# Patient Record
Sex: Female | Born: 1935 | Race: White | Hispanic: No | Marital: Married | Smoking: Never smoker
Health system: Southern US, Community
[De-identification: ages and names within clinical notes are randomized; demographics above are authoritative.]

## PROBLEM LIST (undated history)

## (undated) DIAGNOSIS — G373 Acute transverse myelitis in demyelinating disease of central nervous system: Secondary | ICD-10-CM

## (undated) DIAGNOSIS — I10 Essential (primary) hypertension: Secondary | ICD-10-CM

## (undated) DIAGNOSIS — K219 Gastro-esophageal reflux disease without esophagitis: Secondary | ICD-10-CM

## (undated) DIAGNOSIS — D649 Anemia, unspecified: Secondary | ICD-10-CM

## (undated) HISTORY — PX: PARAESOPHAGEAL HERNIA REPAIR: SHX2161

## (undated) HISTORY — PX: JOINT REPLACEMENT: SHX530

## (undated) HISTORY — PX: EPIDURAL BLOOD PATCH: SHX1517

## (undated) HISTORY — PX: FIXATION KYPHOPLASTY: SHX860

## (undated) HISTORY — PX: EYE SURGERY: SHX253

## (undated) HISTORY — PX: UPPER GI ENDOSCOPY: SHX6162

## (undated) HISTORY — PX: BACK SURGERY: SHX140

---
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
## 2012-01-25 DIAGNOSIS — M5137 Other intervertebral disc degeneration, lumbosacral region: Secondary | ICD-10-CM | POA: Insufficient documentation

## 2012-01-25 DIAGNOSIS — M51379 Other intervertebral disc degeneration, lumbosacral region without mention of lumbar back pain or lower extremity pain: Secondary | ICD-10-CM | POA: Insufficient documentation

## 2012-08-03 DIAGNOSIS — S12000A Unspecified displaced fracture of first cervical vertebra, initial encounter for closed fracture: Secondary | ICD-10-CM | POA: Insufficient documentation

## 2012-08-03 DIAGNOSIS — R739 Hyperglycemia, unspecified: Secondary | ICD-10-CM | POA: Insufficient documentation

## 2012-08-03 DIAGNOSIS — D509 Iron deficiency anemia, unspecified: Secondary | ICD-10-CM | POA: Insufficient documentation

## 2012-08-03 DIAGNOSIS — R0789 Other chest pain: Secondary | ICD-10-CM | POA: Insufficient documentation

## 2012-08-03 DIAGNOSIS — R1310 Dysphagia, unspecified: Secondary | ICD-10-CM | POA: Insufficient documentation

## 2012-11-12 DIAGNOSIS — K449 Diaphragmatic hernia without obstruction or gangrene: Secondary | ICD-10-CM | POA: Insufficient documentation

## 2014-01-01 DIAGNOSIS — G373 Acute transverse myelitis in demyelinating disease of central nervous system: Secondary | ICD-10-CM

## 2014-01-01 HISTORY — DX: Acute transverse myelitis in demyelinating disease of central nervous system: G37.3

## 2015-05-16 DIAGNOSIS — G373 Acute transverse myelitis in demyelinating disease of central nervous system: Secondary | ICD-10-CM | POA: Insufficient documentation

## 2015-05-19 DIAGNOSIS — R29898 Other symptoms and signs involving the musculoskeletal system: Secondary | ICD-10-CM | POA: Insufficient documentation

## 2015-05-20 DIAGNOSIS — I10 Essential (primary) hypertension: Secondary | ICD-10-CM | POA: Insufficient documentation

## 2016-10-29 DIAGNOSIS — D500 Iron deficiency anemia secondary to blood loss (chronic): Secondary | ICD-10-CM | POA: Insufficient documentation

## 2016-10-29 DIAGNOSIS — Z789 Other specified health status: Secondary | ICD-10-CM | POA: Insufficient documentation

## 2016-10-29 DIAGNOSIS — K219 Gastro-esophageal reflux disease without esophagitis: Secondary | ICD-10-CM | POA: Insufficient documentation

## 2016-10-29 DIAGNOSIS — Z7409 Other reduced mobility: Secondary | ICD-10-CM | POA: Insufficient documentation

## 2018-02-21 ENCOUNTER — Other Ambulatory Visit: Payer: Self-pay | Admitting: Neurosurgery

## 2018-02-21 ENCOUNTER — Telehealth: Payer: Self-pay | Admitting: Nurse Practitioner

## 2018-02-21 DIAGNOSIS — G970 Cerebrospinal fluid leak from spinal puncture: Secondary | ICD-10-CM

## 2018-02-21 NOTE — Telephone Encounter (Signed)
Phone call to patient to verify medication list and allergies for myelogram procedure. Pt instructed to hold Effexor for 48hrs prior to myelogram appointment time. Pt verbalized understanding. 

## 2018-03-03 ENCOUNTER — Ambulatory Visit
Admission: RE | Admit: 2018-03-03 | Discharge: 2018-03-03 | Disposition: A | Discharge status: Home / Self Care | Payer: Medicare PPO | Source: Ambulatory Visit | Attending: Neurosurgery | Admitting: Neurosurgery

## 2018-03-03 DIAGNOSIS — G970 Cerebrospinal fluid leak from spinal puncture: Secondary | ICD-10-CM

## 2018-03-03 MED ORDER — IOPAMIDOL (ISOVUE-M 200) INJECTION 41%: 15.0000 mL | Freq: Once | INTRAMUSCULAR | Status: DC

## 2018-03-03 NOTE — Progress Notes (Signed)
Patient states she has been off Effexor for at least the past two days. 

## 2018-03-03 NOTE — Discharge Instructions (Signed)
Myelogram Discharge Instructions  1. Go home and rest quietly for the next 24 hours.  It is important to lie flat for the next 24 hours.  Get up only to go to the restroom.  You may lie in the bed or on a couch on your back, your stomach, your left side or your right side.  You may have one pillow under your head.  You may have pillows between your knees while you are on your side or under your knees while you are on your back.  2. DO NOT drive today.  Recline the seat as far back as it will go, while still wearing your seat belt, on the way home.  3. You may get up to go to the bathroom as needed.  You may sit up for 10 minutes to eat.  You may resume your normal diet and medications unless otherwise indicated.  Drink lots of extra fluids today and tomorrow.  4. The incidence of headache, nausea, or vomiting is about 5% (one in 20 patients).  If you develop a headache, lie flat and drink plenty of fluids until the headache goes away.  Caffeinated beverages may be helpful.  If you develop severe nausea and vomiting or a headache that does not go away with flat bed rest, call [phone].  5. You may resume normal activities after your 24 hours of bed rest is over; however, do not exert yourself strongly or do any heavy lifting tomorrow. If when you get up you have a headache when standing, go back to bed and force fluids for another 24 hours.  6. Call your physician for a follow-up appointment.  The results of your myelogram will be sent directly to your physician by the following day.  7. If you have any questions or if complications develop after you arrive home, please call [phone].  Discharge instructions have been explained to the patient.  The patient, or the person responsible for the patient, fully understands these instructions.  YOU MAY RESTART YOUR [facility] TOMORROW 03/04/2018 AT 1:00PM.

## 2018-03-04 ENCOUNTER — Other Ambulatory Visit: Payer: Self-pay | Admitting: Neurosurgery

## 2018-03-04 DIAGNOSIS — G970 Cerebrospinal fluid leak from spinal puncture: Secondary | ICD-10-CM

## 2018-03-17 ENCOUNTER — Other Ambulatory Visit: Payer: Self-pay

## 2018-03-17 ENCOUNTER — Ambulatory Visit
Admission: RE | Admit: 2018-03-17 | Discharge: 2018-03-17 | Disposition: A | Discharge status: Home / Self Care | Payer: Medicare PPO | Source: Ambulatory Visit | Attending: Neurosurgery | Admitting: Neurosurgery

## 2018-03-17 DIAGNOSIS — G970 Cerebrospinal fluid leak from spinal puncture: Secondary | ICD-10-CM

## 2018-03-17 MED ORDER — IOPAMIDOL (ISOVUE-M 200) INJECTION 41%
20.0000 mL | Freq: Once | INTRAMUSCULAR | Status: AC
  Administered 2018-03-17: 20 mL via INTRATHECAL

## 2018-03-17 MED ORDER — DIAZEPAM 5 MG PO TABS
5.0000 mg | ORAL_TABLET | Freq: Once | ORAL | Status: AC
  Administered 2018-03-17: 5 mg via ORAL

## 2018-03-17 NOTE — Discharge Instructions (Signed)
Myelogram Discharge Instructions  1. Go home and rest quietly for the next 24 hours.  It is important to lie flat for the next 24 hours.  Get up only to go to the restroom.  You may lie in the bed or on a couch on your back, your stomach, your left side or your right side.  You may have one pillow under your head.  You may have pillows between your knees while you are on your side or under your knees while you are on your back.  2. DO NOT drive today.  Recline the seat as far back as it will go, while still wearing your seat belt, on the way home.  3. You may get up to go to the bathroom as needed.  You may sit up for 10 minutes to eat.  You may resume your normal diet and medications unless otherwise indicated.  Drink lots of extra fluids today and tomorrow.  4. The incidence of headache, nausea, or vomiting is about 5% (one in 20 patients).  If you develop a headache, lie flat and drink plenty of fluids until the headache goes away.  Caffeinated beverages may be helpful.  If you develop severe nausea and vomiting or a headache that does not go away with flat bed rest, call [phone].  5. You may resume normal activities after your 24 hours of bed rest is over; however, do not exert yourself strongly or do any heavy lifting tomorrow. If when you get up you have a headache when standing, go back to bed and force fluids for another 24 hours.  6. Call your physician for a follow-up appointment.  The results of your myelogram will be sent directly to your physician by the following day.  7. If you have any questions or if complications develop after you arrive home, please call [phone].  Discharge instructions have been explained to the patient.  The patient, or the person responsible for the patient, fully understands these instructions.  YOU MAY RESTART YOUR [facility] TOMORROW 03/18/2018 AT 10:30AM.

## 2018-07-17 ENCOUNTER — Other Ambulatory Visit: Payer: Self-pay | Admitting: Neurosurgery

## 2018-08-13 ENCOUNTER — Inpatient Hospital Stay (HOSPITAL_COMMUNITY): Admission: RE | Admit: 2018-08-13 | Payer: Medicare PPO | Source: Ambulatory Visit

## 2018-08-14 ENCOUNTER — Other Ambulatory Visit (HOSPITAL_COMMUNITY): Payer: Medicare PPO

## 2018-08-15 ENCOUNTER — Encounter (HOSPITAL_COMMUNITY): Admission: RE | Payer: Self-pay | Source: Home / Self Care

## 2018-08-15 ENCOUNTER — Ambulatory Visit (HOSPITAL_COMMUNITY): Admission: RE | Admit: 2018-08-15 | Payer: Medicare PPO | Source: Home / Self Care

## 2018-08-15 SURGERY — LUMBAR LAMINECTOMY/DECOMPRESSION MICRODISCECTOMY 1 LEVEL
Anesthesia: General

## 2018-08-27 ENCOUNTER — Other Ambulatory Visit: Payer: Self-pay | Admitting: Neurosurgery

## 2018-09-12 ENCOUNTER — Other Ambulatory Visit: Payer: Self-pay | Admitting: Neurosurgery

## 2018-09-15 ENCOUNTER — Other Ambulatory Visit (HOSPITAL_COMMUNITY)
Admission: RE | Admit: 2018-09-15 | Discharge: 2018-09-15 | Disposition: A | Discharge status: Home / Self Care | Payer: Medicare PPO | Source: Ambulatory Visit | Attending: Neurosurgery | Admitting: Neurosurgery

## 2018-09-15 DIAGNOSIS — Z20828 Contact with and (suspected) exposure to other viral communicable diseases: Secondary | ICD-10-CM | POA: Diagnosis not present

## 2018-09-15 DIAGNOSIS — Z01812 Encounter for preprocedural laboratory examination: Secondary | ICD-10-CM | POA: Diagnosis not present

## 2018-09-15 LAB — SARS CORONAVIRUS 2 (TAT 6-24 HRS): SARS Coronavirus 2: NEGATIVE

## 2018-09-16 ENCOUNTER — Inpatient Hospital Stay (HOSPITAL_COMMUNITY)
Admission: RE | Admit: 2018-09-16 | Discharge: 2018-09-16 | Disposition: A | Discharge status: Home / Self Care | Payer: Medicare PPO | Source: Ambulatory Visit

## 2018-09-17 ENCOUNTER — Encounter (HOSPITAL_COMMUNITY): Admission: RE | Payer: Self-pay | Source: Home / Self Care

## 2018-09-17 ENCOUNTER — Ambulatory Visit (HOSPITAL_COMMUNITY): Admission: RE | Admit: 2018-09-17 | Payer: Medicare PPO | Source: Home / Self Care

## 2018-09-17 SURGERY — LUMBAR LAMINECTOMY/DECOMPRESSION MICRODISCECTOMY 1 LEVEL
Anesthesia: General

## 2018-09-23 ENCOUNTER — Other Ambulatory Visit: Payer: Self-pay | Admitting: Neurosurgery

## 2018-10-07 NOTE — Pre-Procedure Instructions (Signed)
[id number] - [place], [place] - [address] [place] [address] Phone: [phone] Fax: [phone]     Your procedure is scheduled on Thursday October 8th.  Report to [facility] Main Entrance "A" at 10:30 A.M., and check in at the Admitting office.  Call this number if you have problems the morning of surgery:  [phone]  Call [phone] if you have any questions prior to your surgery date Monday-Friday 8am-4pm    Remember:  Do not eat or drink after midnight the night before your surgery     Take these medicines the morning of surgery with A SIP OF WATER  amLODipine (NORVASC)  cloNIDine (CATAPRES) desvenlafaxine (PRISTIQ)  As of today, STOP taking any Aspirin (unless otherwise instructed by your surgeon), Aleve, Naproxen, Ibuprofen, Motrin, Advil, Goody's, BC's, all herbal medications, fish oil, and all vitamins.    The Morning of Surgery  Do not wear jewelry, make-up or nail polish.  Do not wear lotions, powders, or perfumes/colognes, or deodorant  Do not shave 48 hours prior to surgery.  Men may shave face and neck.  Do not bring valuables to the hospital.  [facility] is not responsible for any belongings or valuables.  If you are a smoker, DO NOT Smoke 24 hours prior to surgery IF you wear a CPAP at night please bring your mask, tubing, and machine the morning of surgery   Remember that you must have someone to transport you home after your surgery, and remain with you for 24 hours if you are discharged the same day.   Contacts, glasses, hearing aids, dentures or bridgework may not be worn into surgery.    Leave your suitcase in the car.  After surgery it may be brought to your room.  For patients admitted to the hospital, discharge time will be determined by your treatment team.  Patients discharged the day of surgery will not be allowed to drive home.    Special instructions:   [facility]- Preparing For  Surgery  Before surgery, you can play an important role. Because skin is not sterile, your skin needs to be as free of germs as possible. You can reduce the number of germs on your skin by washing with CHG (chlorahexidine gluconate) Soap before surgery.  CHG is an antiseptic cleaner which kills germs and bonds with the skin to continue killing germs even after washing.    Oral Hygiene is also important to reduce your risk of infection.  Remember - BRUSH YOUR TEETH THE MORNING OF SURGERY WITH YOUR REGULAR TOOTHPASTE  Please do not use if you have an allergy to CHG or antibacterial soaps. If your skin becomes reddened/irritated stop using the CHG.  Do not shave (including legs and underarms) for at least 48 hours prior to first CHG shower. It is OK to shave your face.  Please follow these instructions carefully.   1. Shower the NIGHT BEFORE SURGERY and the MORNING OF SURGERY with CHG Soap.   2. If you chose to wash your hair, wash your hair first as usual with your normal shampoo.  3. After you shampoo, rinse your hair and body thoroughly to remove the shampoo.  4. Use CHG as you would any other liquid soap. You can apply CHG directly to the skin and wash gently with a scrungie or a clean washcloth.   5. Apply the CHG Soap to your body ONLY FROM THE NECK DOWN.  Do not use on  open wounds or open sores. Avoid contact with your eyes, ears, mouth and genitals (private parts). Wash Face and genitals (private parts)  with your normal soap.   6. Wash thoroughly, paying special attention to the area where your surgery will be performed.  7. Thoroughly rinse your body with warm water from the neck down.  8. DO NOT shower/wash with your normal soap after using and rinsing off the CHG Soap.  9. Pat yourself dry with a CLEAN TOWEL.  10. Wear CLEAN PAJAMAS to bed the night before surgery, wear comfortable clothes the morning of surgery  11. Place CLEAN SHEETS on your bed the night of your first  shower and DO NOT SLEEP WITH PETS.    Day of Surgery:  Do not apply any deodorants/lotions. Please shower the morning of surgery with the CHG soap  Please wear clean clothes to the hospital/surgery center.   Remember to brush your teeth WITH YOUR REGULAR TOOTHPASTE.   Please read over the following fact sheets that you were given.

## 2018-10-08 ENCOUNTER — Other Ambulatory Visit (HOSPITAL_COMMUNITY)
Admission: RE | Admit: 2018-10-08 | Discharge: 2018-10-08 | Disposition: A | Discharge status: Home / Self Care | Payer: Medicare PPO | Source: Ambulatory Visit | Attending: Neurosurgery | Admitting: Neurosurgery

## 2018-10-08 ENCOUNTER — Encounter (HOSPITAL_COMMUNITY): Payer: Self-pay

## 2018-10-08 ENCOUNTER — Encounter (HOSPITAL_COMMUNITY)
Admission: RE | Admit: 2018-10-08 | Discharge: 2018-10-08 | Disposition: A | Discharge status: Home / Self Care | Payer: Medicare PPO | Source: Ambulatory Visit | Attending: Neurosurgery | Admitting: Neurosurgery

## 2018-10-08 ENCOUNTER — Other Ambulatory Visit: Payer: Self-pay

## 2018-10-08 DIAGNOSIS — Z01810 Encounter for preprocedural cardiovascular examination: Secondary | ICD-10-CM | POA: Insufficient documentation

## 2018-10-08 DIAGNOSIS — Z01812 Encounter for preprocedural laboratory examination: Secondary | ICD-10-CM | POA: Insufficient documentation

## 2018-10-08 DIAGNOSIS — R9431 Abnormal electrocardiogram [ECG] [EKG]: Secondary | ICD-10-CM | POA: Insufficient documentation

## 2018-10-08 DIAGNOSIS — Z20828 Contact with and (suspected) exposure to other viral communicable diseases: Secondary | ICD-10-CM | POA: Insufficient documentation

## 2018-10-08 HISTORY — DX: Anemia, unspecified: D64.9

## 2018-10-08 HISTORY — DX: Gastro-esophageal reflux disease without esophagitis: K21.9

## 2018-10-08 HISTORY — DX: Essential (primary) hypertension: I10

## 2018-10-08 LAB — SURGICAL PCR SCREEN
MRSA, PCR: NEGATIVE
Staphylococcus aureus: NEGATIVE

## 2018-10-08 LAB — CBC
HCT: 44.8 % (ref 36.0–46.0)
Hemoglobin: 14.9 g/dL (ref 12.0–15.0)
MCH: 30.7 pg (ref 26.0–34.0)
MCHC: 33.3 g/dL (ref 30.0–36.0)
MCV: 92.2 fL (ref 80.0–100.0)
Platelets: 337 10*3/uL (ref 150–400)
RBC: 4.86 MIL/uL (ref 3.87–5.11)
RDW: 14.1 % (ref 11.5–15.5)
WBC: 9.9 10*3/uL (ref 4.0–10.5)
nRBC: 0 % (ref 0.0–0.2)

## 2018-10-08 LAB — BASIC METABOLIC PANEL
Anion gap: 13 (ref 5–15)
BUN: 30 mg/dL — ABNORMAL HIGH (ref 8–23)
CO2: 24 mmol/L (ref 22–32)
Calcium: 9.7 mg/dL (ref 8.9–10.3)
Chloride: 98 mmol/L (ref 98–111)
Creatinine, Ser: 1.12 mg/dL — ABNORMAL HIGH (ref 0.44–1.00)
GFR calc Af Amer: 54 mL/min — ABNORMAL LOW (ref >60)
GFR calc non Af Amer: 46 mL/min — ABNORMAL LOW (ref >60)
Glucose, Bld: 89 mg/dL (ref 70–99)
Potassium: 3.4 mmol/L — ABNORMAL LOW (ref 3.5–5.1)
Sodium: 135 mmol/L (ref 135–145)

## 2018-10-08 LAB — SARS CORONAVIRUS 2 (TAT 6-24 HRS): SARS Coronavirus 2: NEGATIVE

## 2018-10-08 NOTE — Progress Notes (Signed)
PCP - Dr. Edythe Lynn- Cyndi Bender, [place] Cardiologist - denies  Chest x-ray - N/A EKG - 10/09/18 Stress Test - ? If done in 2018, records requested ECHO - 08/05/12 Cardiac Cath - denies  Sleep Study - denies  Aspirin Instructions: Patient instructed to hold all Aspirin, NSAID's, herbal medications, fish oil and vitamins 7 days prior to surgery.   Anesthesia review: cardiac studies requested, h/o transverse myelitis  Patient denies shortness of breath, fever, cough and chest pain at PAT appointment   Patient verbalized understanding of instructions that were given to them at the PAT appointment. Patient was also instructed that they will need to review over the PAT instructions again at home before surgery.

## 2018-10-08 NOTE — Anesthesia Preprocedure Evaluation (Addendum)
Anesthesia Evaluation  Patient identified by MRN, date of birth, ID band Patient awake    Reviewed: Allergy & Precautions, NPO status , Patient's Chart, lab work & pertinent test results  Airway Mallampati: I  TM Distance: >3 FB Neck ROM: Full    Dental   Pulmonary    Pulmonary exam normal        Cardiovascular hypertension, Pt. on medications Normal cardiovascular exam     Neuro/Psych    GI/Hepatic GERD  Medicated and Controlled,  Endo/Other    Renal/GU      Musculoskeletal   Abdominal   Peds  Hematology   Anesthesia Other Findings   Reproductive/Obstetrics                            Anesthesia Physical Anesthesia Plan  ASA: II  Anesthesia Plan: General   Post-op Pain Management:    Induction: Intravenous  PONV Risk Score and Plan: 3 and Ondansetron, Midazolam and Dexamethasone  Airway Management Planned: Oral ETT  Additional Equipment:   Intra-op Plan:   Post-operative Plan: Extubation in OR  Informed Consent: I have reviewed the patients History and Physical, chart, labs and discussed the procedure including the risks, benefits and alternatives for the proposed anesthesia with the patient or authorized representative who has indicated his/her understanding and acceptance.       Plan Discussed with: CRNA and Surgeon  Anesthesia Plan Comments: (TTE 08/05/12 (care everywhere): Interpretation Summary A complete two-dimensional transthoracic echocardiogram was performed (2D, M-mode, Doppler and color flow Doppler). The study was technically adequate. The tricupid valve leaflets are structurally normal with trace regurgitation. The aortic valve is trileaflet. The left ventricle is normal in size. There is mild concentric left ventricular hypertrophy. The left ventricular wall motion is normal. The left ventricular ejection fraction is normal (60-65%). The left ventricular  diastolic function is normal.)       Anesthesia Quick Evaluation

## 2018-10-09 ENCOUNTER — Inpatient Hospital Stay (HOSPITAL_COMMUNITY)
Admission: AD | Admit: 2018-10-09 | Discharge: 2018-10-15 | DRG: 029 | Disposition: A | Discharge status: Rehabilitation Facility | Payer: Medicare PPO | Attending: Neurosurgery | Admitting: Neurosurgery

## 2018-10-09 ENCOUNTER — Ambulatory Visit (HOSPITAL_COMMUNITY): Payer: Medicare PPO

## 2018-10-09 ENCOUNTER — Ambulatory Visit (HOSPITAL_COMMUNITY): Payer: Medicare PPO | Admitting: Certified Registered Nurse Anesthetist

## 2018-10-09 ENCOUNTER — Encounter (HOSPITAL_COMMUNITY)
Admission: AD | Disposition: A | Discharge status: Rehabilitation Facility | Payer: Self-pay | Source: Home / Self Care | Attending: Neurosurgery

## 2018-10-09 ENCOUNTER — Encounter (HOSPITAL_COMMUNITY): Payer: Self-pay | Admitting: *Deleted

## 2018-10-09 ENCOUNTER — Ambulatory Visit (HOSPITAL_COMMUNITY): Payer: Medicare PPO | Admitting: Physician Assistant

## 2018-10-09 DIAGNOSIS — Z888 Allergy status to other drugs, medicaments and biological substances status: Secondary | ICD-10-CM

## 2018-10-09 DIAGNOSIS — Z8249 Family history of ischemic heart disease and other diseases of the circulatory system: Secondary | ICD-10-CM

## 2018-10-09 DIAGNOSIS — G970 Cerebrospinal fluid leak from spinal puncture: Secondary | ICD-10-CM | POA: Diagnosis present

## 2018-10-09 DIAGNOSIS — K5903 Drug induced constipation: Secondary | ICD-10-CM

## 2018-10-09 DIAGNOSIS — Z79899 Other long term (current) drug therapy: Secondary | ICD-10-CM

## 2018-10-09 DIAGNOSIS — F329 Major depressive disorder, single episode, unspecified: Secondary | ICD-10-CM

## 2018-10-09 DIAGNOSIS — Z96651 Presence of right artificial knee joint: Secondary | ICD-10-CM | POA: Diagnosis present

## 2018-10-09 DIAGNOSIS — I10 Essential (primary) hypertension: Secondary | ICD-10-CM | POA: Diagnosis present

## 2018-10-09 DIAGNOSIS — Z23 Encounter for immunization: Secondary | ICD-10-CM

## 2018-10-09 DIAGNOSIS — Z419 Encounter for procedure for purposes other than remedying health state, unspecified: Secondary | ICD-10-CM

## 2018-10-09 DIAGNOSIS — N179 Acute kidney failure, unspecified: Secondary | ICD-10-CM | POA: Diagnosis present

## 2018-10-09 DIAGNOSIS — K219 Gastro-esophageal reflux disease without esophagitis: Secondary | ICD-10-CM | POA: Diagnosis present

## 2018-10-09 DIAGNOSIS — Z833 Family history of diabetes mellitus: Secondary | ICD-10-CM

## 2018-10-09 DIAGNOSIS — Z20828 Contact with and (suspected) exposure to other viral communicable diseases: Secondary | ICD-10-CM | POA: Diagnosis present

## 2018-10-09 DIAGNOSIS — G9609 Other spinal cerebrospinal fluid leak: Principal | ICD-10-CM | POA: Diagnosis present

## 2018-10-09 DIAGNOSIS — E876 Hypokalemia: Secondary | ICD-10-CM | POA: Diagnosis present

## 2018-10-09 DIAGNOSIS — G8918 Other acute postprocedural pain: Secondary | ICD-10-CM

## 2018-10-09 HISTORY — PX: LUMBAR LAMINECTOMY/DECOMPRESSION MICRODISCECTOMY: SHX5026

## 2018-10-09 SURGERY — LUMBAR LAMINECTOMY/DECOMPRESSION MICRODISCECTOMY 1 LEVEL
Anesthesia: General

## 2018-10-09 MED ORDER — ROCURONIUM BROMIDE 10 MG/ML (PF) SYRINGE
PREFILLED_SYRINGE | INTRAVENOUS | Status: DC | PRN
  Administered 2018-10-09: 80 mg via INTRAVENOUS

## 2018-10-09 MED ORDER — BUTALBITAL-APAP-CAFFEINE 50-325-40 MG PO TABS
1.0000 | ORAL_TABLET | Freq: Two times a day (BID) | ORAL | Status: DC
  Administered 2018-10-09 – 2018-10-15 (×12): 1 via ORAL
  Filled 2018-10-09 (×12): qty 1

## 2018-10-09 MED ORDER — FENTANYL CITRATE (PF) 250 MCG/5ML IJ SOLN
INTRAMUSCULAR | Status: DC | PRN
  Administered 2018-10-09: 50 ug via INTRAVENOUS
  Administered 2018-10-09: 25 ug via INTRAVENOUS
  Administered 2018-10-09: 125 ug via INTRAVENOUS

## 2018-10-09 MED ORDER — ONDANSETRON HCL 4 MG/2ML IJ SOLN
4.0000 mg | Freq: Four times a day (QID) | INTRAMUSCULAR | Status: DC | PRN
  Administered 2018-10-11 – 2018-10-14 (×2): 4 mg via INTRAVENOUS
  Filled 2018-10-09 (×5): qty 2

## 2018-10-09 MED ORDER — ONDANSETRON HCL 4 MG/2ML IJ SOLN
INTRAMUSCULAR | Status: DC | PRN
  Administered 2018-10-09: 4 mg via INTRAVENOUS

## 2018-10-09 MED ORDER — LACTATED RINGERS IV SOLN: INTRAVENOUS | Status: DC

## 2018-10-09 MED ORDER — CEFAZOLIN SODIUM-DEXTROSE 2-4 GM/100ML-% IV SOLN
INTRAVENOUS | Status: AC
  Filled 2018-10-09: qty 100

## 2018-10-09 MED ORDER — CYCLOBENZAPRINE HCL 10 MG PO TABS: 10.0000 mg | ORAL_TABLET | Freq: Three times a day (TID) | ORAL | Status: DC | PRN

## 2018-10-09 MED ORDER — THROMBIN 5000 UNITS EX SOLR
CUTANEOUS | Status: DC | PRN
  Administered 2018-10-09 (×2): 5000 [IU] via TOPICAL

## 2018-10-09 MED ORDER — POTASSIUM CHLORIDE IN NACL 20-0.9 MEQ/L-% IV SOLN: INTRAVENOUS | Status: DC

## 2018-10-09 MED ORDER — MENTHOL 3 MG MT LOZG: 1.0000 | LOZENGE | OROMUCOSAL | Status: DC | PRN

## 2018-10-09 MED ORDER — VALSARTAN-HYDROCHLOROTHIAZIDE 80-12.5 MG PO TABS: 1.0000 | ORAL_TABLET | Freq: Every day | ORAL | Status: DC

## 2018-10-09 MED ORDER — LIDOCAINE-EPINEPHRINE 0.5 %-1:200000 IJ SOLN
INTRAMUSCULAR | Status: DC | PRN
  Administered 2018-10-09: 10 mL

## 2018-10-09 MED ORDER — 0.9 % SODIUM CHLORIDE (POUR BTL) OPTIME
TOPICAL | Status: DC | PRN
  Administered 2018-10-09: 12:00:00 1000 mL

## 2018-10-09 MED ORDER — DEXAMETHASONE SODIUM PHOSPHATE 10 MG/ML IJ SOLN
INTRAMUSCULAR | Status: DC | PRN
  Administered 2018-10-09: 5 mg via INTRAVENOUS

## 2018-10-09 MED ORDER — IRBESARTAN 75 MG PO TABS
75.0000 mg | ORAL_TABLET | Freq: Every day | ORAL | Status: DC
  Administered 2018-10-10 – 2018-10-15 (×6): 75 mg via ORAL
  Filled 2018-10-09 (×6): qty 1

## 2018-10-09 MED ORDER — FENTANYL CITRATE (PF) 250 MCG/5ML IJ SOLN
INTRAMUSCULAR | Status: AC
  Filled 2018-10-09: qty 5

## 2018-10-09 MED ORDER — CLONIDINE HCL 0.2 MG PO TABS: 0.2000 mg | ORAL_TABLET | Freq: Four times a day (QID) | ORAL | Status: DC | PRN

## 2018-10-09 MED ORDER — SODIUM CHLORIDE 0.9% FLUSH: 3.0000 mL | INTRAVENOUS | Status: DC | PRN

## 2018-10-09 MED ORDER — OXYCODONE HCL 5 MG PO TABS
5.0000 mg | ORAL_TABLET | ORAL | Status: DC | PRN
  Administered 2018-10-15: 5 mg via ORAL
  Filled 2018-10-09: qty 1

## 2018-10-09 MED ORDER — BUTALBITAL-APAP-CAFFEINE 50-325-40 MG PO CAPS: 1.0000 | ORAL_CAPSULE | Freq: Two times a day (BID) | ORAL | Status: DC

## 2018-10-09 MED ORDER — ONDANSETRON HCL 4 MG PO TABS
4.0000 mg | ORAL_TABLET | Freq: Four times a day (QID) | ORAL | Status: DC | PRN
  Administered 2018-10-09 – 2018-10-14 (×5): 4 mg via ORAL
  Filled 2018-10-09 (×5): qty 1

## 2018-10-09 MED ORDER — ACETAMINOPHEN 650 MG RE SUPP: 650.0000 mg | RECTAL | Status: DC | PRN

## 2018-10-09 MED ORDER — ROCURONIUM BROMIDE 10 MG/ML (PF) SYRINGE
PREFILLED_SYRINGE | INTRAVENOUS | Status: AC
  Filled 2018-10-09: qty 10

## 2018-10-09 MED ORDER — BUPIVACAINE HCL (PF) 0.5 % IJ SOLN
INTRAMUSCULAR | Status: AC
  Filled 2018-10-09: qty 30

## 2018-10-09 MED ORDER — HEMOSTATIC AGENTS (NO CHARGE) OPTIME
TOPICAL | Status: DC | PRN
  Administered 2018-10-09: 1 via TOPICAL

## 2018-10-09 MED ORDER — SODIUM CHLORIDE 0.9 % IV SOLN
INTRAVENOUS | Status: DC | PRN
  Administered 2018-10-09: 14:00:00 25 ug/min via INTRAVENOUS

## 2018-10-09 MED ORDER — SODIUM CHLORIDE 0.9% FLUSH
3.0000 mL | Freq: Two times a day (BID) | INTRAVENOUS | Status: DC
  Administered 2018-10-10 – 2018-10-15 (×11): 3 mL via INTRAVENOUS

## 2018-10-09 MED ORDER — PHENOL 1.4 % MT LIQD: 1.0000 | OROMUCOSAL | Status: DC | PRN

## 2018-10-09 MED ORDER — OXYCODONE HCL 5 MG PO TABS
10.0000 mg | ORAL_TABLET | ORAL | Status: DC | PRN
  Administered 2018-10-10 – 2018-10-12 (×7): 10 mg via ORAL
  Filled 2018-10-09 (×7): qty 2

## 2018-10-09 MED ORDER — MEPERIDINE HCL 25 MG/ML IJ SOLN: 6.2500 mg | INTRAMUSCULAR | Status: DC | PRN

## 2018-10-09 MED ORDER — SUGAMMADEX SODIUM 200 MG/2ML IV SOLN
INTRAVENOUS | Status: DC | PRN
  Administered 2018-10-09: 150 mg via INTRAVENOUS

## 2018-10-09 MED ORDER — SODIUM CHLORIDE 0.9 % IV SOLN: 250.0000 mL | INTRAVENOUS | Status: DC

## 2018-10-09 MED ORDER — PROPOFOL 10 MG/ML IV BOLUS
INTRAVENOUS | Status: AC
  Filled 2018-10-09: qty 20

## 2018-10-09 MED ORDER — HYDROMORPHONE HCL 1 MG/ML IJ SOLN
INTRAMUSCULAR | Status: AC
  Filled 2018-10-09: qty 1

## 2018-10-09 MED ORDER — HYDROCHLOROTHIAZIDE 12.5 MG PO CAPS
12.5000 mg | ORAL_CAPSULE | Freq: Every day | ORAL | Status: DC
  Administered 2018-10-10 – 2018-10-15 (×6): 12.5 mg via ORAL
  Filled 2018-10-09 (×6): qty 1

## 2018-10-09 MED ORDER — LIDOCAINE HCL (CARDIAC) PF 100 MG/5ML IV SOSY
PREFILLED_SYRINGE | INTRAVENOUS | Status: DC | PRN
  Administered 2018-10-09: 100 mg via INTRAVENOUS

## 2018-10-09 MED ORDER — CEFAZOLIN SODIUM-DEXTROSE 2-3 GM-%(50ML) IV SOLR
INTRAVENOUS | Status: DC | PRN
  Administered 2018-10-09: 2 g via INTRAVENOUS

## 2018-10-09 MED ORDER — ONDANSETRON HCL 4 MG/2ML IJ SOLN
INTRAMUSCULAR | Status: AC
  Filled 2018-10-09: qty 2

## 2018-10-09 MED ORDER — HYDROMORPHONE HCL 1 MG/ML IJ SOLN
0.2500 mg | INTRAMUSCULAR | Status: DC | PRN
  Administered 2018-10-09: 0.5 mg via INTRAVENOUS

## 2018-10-09 MED ORDER — DESMOPRESSIN ACETATE 0.1 MG PO TABS
0.1000 mg | ORAL_TABLET | Freq: Every day | ORAL | Status: DC
  Administered 2018-10-09 – 2018-10-14 (×6): 0.1 mg via ORAL
  Filled 2018-10-09 (×7): qty 1

## 2018-10-09 MED ORDER — BUPIVACAINE HCL (PF) 0.5 % IJ SOLN
INTRAMUSCULAR | Status: DC | PRN
  Administered 2018-10-09: 20 mL

## 2018-10-09 MED ORDER — CELECOXIB 200 MG PO CAPS
200.0000 mg | ORAL_CAPSULE | Freq: Two times a day (BID) | ORAL | Status: DC
  Administered 2018-10-09 – 2018-10-15 (×12): 200 mg via ORAL
  Filled 2018-10-09 (×12): qty 1

## 2018-10-09 MED ORDER — AMLODIPINE BESYLATE 10 MG PO TABS
10.0000 mg | ORAL_TABLET | Freq: Every day | ORAL | Status: DC
  Administered 2018-10-10 – 2018-10-15 (×6): 10 mg via ORAL
  Filled 2018-10-09 (×6): qty 1

## 2018-10-09 MED ORDER — LACTATED RINGERS IV SOLN
INTRAVENOUS | Status: DC
  Administered 2018-10-09: 14:00:00 via INTRAVENOUS

## 2018-10-09 MED ORDER — PROPOFOL 10 MG/ML IV BOLUS
INTRAVENOUS | Status: DC | PRN
  Administered 2018-10-09: 120 mg via INTRAVENOUS

## 2018-10-09 MED ORDER — LIDOCAINE-EPINEPHRINE 0.5 %-1:200000 IJ SOLN
INTRAMUSCULAR | Status: AC
  Filled 2018-10-09: qty 1

## 2018-10-09 MED ORDER — ONDANSETRON HCL 4 MG/2ML IJ SOLN: 4.0000 mg | Freq: Once | INTRAMUSCULAR | Status: DC | PRN

## 2018-10-09 MED ORDER — ACETAMINOPHEN 325 MG PO TABS
650.0000 mg | ORAL_TABLET | ORAL | Status: DC | PRN
  Administered 2018-10-13: 16:00:00 650 mg via ORAL
  Filled 2018-10-09: qty 2

## 2018-10-09 MED ORDER — THROMBIN 5000 UNITS EX SOLR
CUTANEOUS | Status: AC
  Filled 2018-10-09: qty 10000

## 2018-10-09 MED ORDER — PHENYLEPHRINE 40 MCG/ML (10ML) SYRINGE FOR IV PUSH (FOR BLOOD PRESSURE SUPPORT)
PREFILLED_SYRINGE | INTRAVENOUS | Status: DC | PRN
  Administered 2018-10-09: 80 ug via INTRAVENOUS

## 2018-10-09 MED ORDER — LIDOCAINE 2% (20 MG/ML) 5 ML SYRINGE
INTRAMUSCULAR | Status: AC
  Filled 2018-10-09: qty 5

## 2018-10-09 MED ORDER — DEXAMETHASONE SODIUM PHOSPHATE 10 MG/ML IJ SOLN
INTRAMUSCULAR | Status: AC
  Filled 2018-10-09: qty 1

## 2018-10-09 SURGICAL SUPPLY — 61 items
BENZOIN TINCTURE PRP APPL 2/3 (GAUZE/BANDAGES/DRESSINGS) IMPLANT
BLADE CLIPPER SURG (BLADE) IMPLANT
BUR MATCHSTICK NEURO 3.0 LAGG (BURR) ×3 IMPLANT
BUR PRECISION FLUTE 5.0 (BURR) ×2 IMPLANT
CANISTER SUCT 3000ML PPV (MISCELLANEOUS) ×3 IMPLANT
CARTRIDGE OIL MAESTRO DRILL (MISCELLANEOUS) ×1 IMPLANT
CLOSURE WOUND 1/2 X4 (GAUZE/BANDAGES/DRESSINGS)
COVER WAND RF STERILE (DRAPES) ×3 IMPLANT
DECANTER SPIKE VIAL GLASS SM (MISCELLANEOUS) ×3 IMPLANT
DERMABOND ADVANCED (GAUZE/BANDAGES/DRESSINGS) ×2
DERMABOND ADVANCED .7 DNX12 (GAUZE/BANDAGES/DRESSINGS) ×1 IMPLANT
DIFFUSER DRILL AIR PNEUMATIC (MISCELLANEOUS) ×3 IMPLANT
DRAPE HALF SHEET 40X57 (DRAPES) ×2 IMPLANT
DRAPE LAPAROTOMY 100X72X124 (DRAPES) ×3 IMPLANT
DRAPE MICROSCOPE LEICA (MISCELLANEOUS) ×3 IMPLANT
DRAPE SURG 17X23 STRL (DRAPES) ×3 IMPLANT
DURAPREP 26ML APPLICATOR (WOUND CARE) ×3 IMPLANT
ELECT REM PT RETURN 9FT ADLT (ELECTROSURGICAL) ×3
ELECTRODE REM PT RTRN 9FT ADLT (ELECTROSURGICAL) ×1 IMPLANT
GAUZE 4X4 16PLY RFD (DISPOSABLE) IMPLANT
GAUZE SPONGE 4X4 12PLY STRL (GAUZE/BANDAGES/DRESSINGS) IMPLANT
GLOVE BIO SURGEON STRL SZ 6.5 (GLOVE) ×3 IMPLANT
GLOVE BIO SURGEON STRL SZ8 (GLOVE) ×2 IMPLANT
GLOVE BIO SURGEONS STRL SZ 6.5 (GLOVE) ×3
GLOVE BIOGEL PI IND STRL 6.5 (GLOVE) IMPLANT
GLOVE BIOGEL PI IND STRL 7.5 (GLOVE) IMPLANT
GLOVE BIOGEL PI IND STRL 8.5 (GLOVE) IMPLANT
GLOVE BIOGEL PI INDICATOR 6.5 (GLOVE) ×4
GLOVE BIOGEL PI INDICATOR 7.5 (GLOVE) ×2
GLOVE BIOGEL PI INDICATOR 8.5 (GLOVE) ×2
GLOVE ECLIPSE 6.5 STRL STRAW (GLOVE) ×3 IMPLANT
GLOVE EXAM NITRILE XL STR (GLOVE) IMPLANT
GLOVE SURG SS PI 7.5 STRL IVOR (GLOVE) ×2 IMPLANT
GOWN STRL REUS W/ TWL LRG LVL3 (GOWN DISPOSABLE) ×2 IMPLANT
GOWN STRL REUS W/ TWL XL LVL3 (GOWN DISPOSABLE) IMPLANT
GOWN STRL REUS W/TWL 2XL LVL3 (GOWN DISPOSABLE) IMPLANT
GOWN STRL REUS W/TWL LRG LVL3 (GOWN DISPOSABLE) ×4
GOWN STRL REUS W/TWL XL LVL3 (GOWN DISPOSABLE) ×2
GRAFT DURAGEN MATRIX 2WX2L ×2 IMPLANT
KIT BASIN OR (CUSTOM PROCEDURE TRAY) ×3 IMPLANT
KIT TURNOVER KIT B (KITS) ×3 IMPLANT
NDL HYPO 25X1 1.5 SAFETY (NEEDLE) ×1 IMPLANT
NDL SPNL 18GX3.5 QUINCKE PK (NEEDLE) IMPLANT
NEEDLE HYPO 25X1 1.5 SAFETY (NEEDLE) ×3 IMPLANT
NEEDLE SPNL 18GX3.5 QUINCKE PK (NEEDLE) ×3 IMPLANT
NS IRRIG 1000ML POUR BTL (IV SOLUTION) ×3 IMPLANT
OIL CARTRIDGE MAESTRO DRILL (MISCELLANEOUS) ×3
PACK LAMINECTOMY NEURO (CUSTOM PROCEDURE TRAY) ×3 IMPLANT
PAD ARMBOARD 7.5X6 YLW CONV (MISCELLANEOUS) ×3 IMPLANT
RUBBERBAND STERILE (MISCELLANEOUS) ×6 IMPLANT
SEALANT ADHERUS EXTEND TIP (MISCELLANEOUS) ×2 IMPLANT
SPONGE LAP 4X18 RFD (DISPOSABLE) IMPLANT
SPONGE SURGIFOAM ABS GEL SZ50 (HEMOSTASIS) ×3 IMPLANT
STRIP CLOSURE SKIN 1/2X4 (GAUZE/BANDAGES/DRESSINGS) IMPLANT
SUT VIC AB 0 CT1 18XCR BRD8 (SUTURE) ×1 IMPLANT
SUT VIC AB 0 CT1 8-18 (SUTURE) ×2
SUT VIC AB 2-0 CT1 18 (SUTURE) ×3 IMPLANT
SUT VIC AB 3-0 SH 8-18 (SUTURE) ×3 IMPLANT
TOWEL GREEN STERILE (TOWEL DISPOSABLE) ×3 IMPLANT
TOWEL GREEN STERILE FF (TOWEL DISPOSABLE) ×3 IMPLANT
WATER STERILE IRR 1000ML POUR (IV SOLUTION) ×3 IMPLANT

## 2018-10-09 NOTE — Anesthesia Procedure Notes (Signed)
Procedure Name: Intubation Date/Time: 10/09/2018 1:39 PM Performed by: Raenette Rover, CRNA Pre-anesthesia Checklist: Patient identified, Emergency Drugs available, Suction available and Patient being monitored Patient Re-evaluated:Patient Re-evaluated prior to induction Oxygen Delivery Method: Circle system utilized Preoxygenation: Pre-oxygenation with 100% oxygen Induction Type: IV induction Ventilation: Mask ventilation without difficulty Laryngoscope Size: Mac and 3 Grade View: Grade I Tube type: Oral Tube size: 7.0 mm Number of attempts: 1 Airway Equipment and Method: Stylet Placement Confirmation: ETT inserted through vocal cords under direct vision,  positive ETCO2 and breath sounds checked- equal and bilateral Secured at: 22 cm Tube secured with: Tape Dental Injury: Teeth and Oropharynx as per pre-operative assessment

## 2018-10-09 NOTE — Progress Notes (Signed)
Dr. Christella Noa evaluated patient at bedside. She is moving all extremities, but still bending legs only minimally and indicating decreased sensation to right lower extremity. Per MD she will stay overnight for monitoring. No additional orders at this time.

## 2018-10-09 NOTE — H&P (Signed)
BP (!) 154/67   Pulse 76   Temp 98 F (36.7 C) (Oral)   Resp 18   Ht 5\' 2"  (1.575 m)   Wt 68.9 kg   SpO2 98%   BMI 27.80 kg/m  Alicia Valdez is an 83 year old woman who presents today for evaluation of a CSF leak after Pain Management epidural steroid injection.  I presume I have no records.  She underwent the epidural that caused this in July and that was the 2nd epidural she had last year.  She had blood patches both in August and October.  Neither 1 was able to terminate the CSF leak.  She has had bad back pain for many years.  She has undergone a kyphoplasty, a hiatal hernia repair.  She is retired, right-handed and lives in [place], [place].     ALLERGIES :  She has no known drug allergies.     MEDICATIONS :  She takes Desvenlafaxine, Butalb-Acetamin-Caff, Clonidine, Amlodipine, Desmopressin Acetate, and Nexium.     FAMILY HISTORY :  Mother and father both deceased at 27 and 91.  Hypertension runs in the family.  There is a family history of hypertension.       She says the pain is worse, sometimes unable to use her right lower extremity, weakness in both leg, numbness and tingling in the soles of feet, headaches and pain in the back of the neck.     REVIEW OF SYSTEMS :  Positive for fever, balance problems, sore throat, shortness of breath, swelling in the feet, nausea, neck pain, leg weakness, back pain, problems with coordination in the legs, anemia.  Says she has severe spinal problems initially caused by a skiing accident.       She presents today in a wheelchair and uses a walker while at home.  She has good strength in the lower extremities.  Reflexes not elicited at the knees or the ankles.  She has normal muscle tone, bulk, coordination in the upper extremities.  Speech is clear and fluent.  Memory, language, attention span, and fund of knowledge are normal.  She has normal mentation.     I have absolutely no studies for review.     I will need  to obtain some anatomic studies since the injections.  I would prefer a myelogram which may very well show where the spinal fluid leak is.  We will get that arranged and I will have them return.      Alicia Valdez comes in today so we could speak face-to-face about her upcoming operation.  She has a small CSF collection anterior to the sac on the right side at L4-5.  I will explore this area because I really cannot think of a better term.  She has had headaches ever since an epidural injection, and headaches are severe.  The headaches are exacerbated by being upright, and are relieved to a degree by being flat.  Given that, I will undertake this exploration, knowing and having explained to Alicia Valdez that this may not change any of the symptoms that she is experiencing.  That I have to go through scar tissue just to get to that area and it may cause another leak.  She is 5 feet 6 inches, weighs 152 pounds.  Blood pressure is 109/68, pulse is 71, temperature is 98.1, pain is 8/10.  She understands and wishes to proceed

## 2018-10-09 NOTE — Op Note (Signed)
BP (!) 166/96 (BP Location: Right Arm)   Pulse 79   Temp 97.6 F (36.4 C)   Resp 15   Ht 5\' 2"  (1.575 m)   Wt 68.9 kg   SpO2 97%   BMI 27.80 kg/m  10/09/2018  3:22 PM  PATIENT:  Alicia Valdez  83 y.o. female with a  Contained csf collection anterior to the sac at L4/5.   PRE-OPERATIVE DIAGNOSIS:  Cerebrospinal fluid leak from spinal puncture  POST-OPERATIVE DIAGNOSIS:  Cerebrospinal fluid leak from spinal puncture  PROCEDURE:  Procedure(s): right lumbar four-five reexploration for cerebrospinal fluid leak  SURGEON:   Surgeon(s): Ashok Pall, MD Erline Levine, MD  ASSISTANTS:Stern, Broadus John  ANESTHESIA:   general  EBL:  Total I/O In: 600 [I.V.:600] Out: 10 [Blood:10]  BLOOD ADMINISTERED:none  CELL SAVER GIVEN:none  COUNT:per nursing  DRAINS: none   SPECIMEN:  No Specimen  DICTATION: Alicia Valdez was taken to the operating room, intubated and placed under a general anesthetic without difficulty. She was positioned prone on a Wilson frame with all pressure points padded. Her back was prepped and draped in a sterile manner. I opened the skin with a 10 blade and carried the dissection down to the thoracolumbar fascia. I used both sharp dissection and the monopolar cautery to expose the lamina of L4, and L5. I confirmed my location with an intraoperative xray.  I used the drill, Kerrison punches, and curettes to perform a semihemilaminectomy of L4. I used the punches to remove the ligamentum flavum to expose the thecal sac. I brought the microscope into the operative field and with Dr.Stern's assistance we started our decompression of the spinal canal, thecal sac and exploration of the anterior canal.I did not encounter spinal fluid, and with microscopic dissection was able to look at the left side. There was a fluid filled mass which was quite small, but appeared to conform to the preop imaging. I placed duragen anterior to the thecal sac, and used a dural sealant. I did remove  some degenerated disc material and facet material. I irrigated the wound, then closed in layers. I approximated the thoracolumbar fascia, subcutaneous, and subcuticular planes with vicryl sutures. I used dermabond for a sterile dressing.   PLAN OF CARE: Admit for overnight observation  PATIENT DISPOSITION:  PACU - hemodynamically stable.   Delay start of Pharmacological VTE agent (>24hrs) due to surgical blood loss or risk of bleeding:  yes

## 2018-10-09 NOTE — Progress Notes (Signed)
Upon getting out of bed to wheelchair to transfer patient, she complained of loss of sensation in right lower extremity and inability to stand.  She is unable to bend her knees or lift legs bilaterally. Dorsiflexion and Plantar flexion are still intact bilaterally. Dr. Christella Noa was notified. He is currently in a surgical case and will see patient once he finishes. Will continue to monitor.

## 2018-10-09 NOTE — Anesthesia Postprocedure Evaluation (Signed)
Anesthesia Post Note  Patient: Alicia Valdez  Procedure(s) Performed: right lumbar four-five reexploration for cerebrospinal fluid leak (N/A )     Patient location during evaluation: PACU Anesthesia Type: General Level of consciousness: awake and alert Pain management: pain level controlled Vital Signs Assessment: post-procedure vital signs reviewed and stable Respiratory status: spontaneous breathing, nonlabored ventilation, respiratory function stable and patient connected to nasal cannula oxygen Cardiovascular status: blood pressure returned to baseline and stable Postop Assessment: no apparent nausea or vomiting Anesthetic complications: no    Last Vitals:  Vitals:   10/09/18 1730 10/09/18 1745  BP: 139/74 139/74  Pulse: 80 82  Resp: 18 19  Temp:    SpO2: 93% 93%    Last Pain:  Vitals:   10/09/18 1745  TempSrc:   PainSc: 0-No pain    LLE Motor Response: Purposeful movement;Responds to commands (10/09/18 1745) LLE Sensation: Full sensation (10/09/18 1745) RLE Motor Response: Purposeful movement;Responds to commands (10/09/18 1745) RLE Sensation: Decreased (10/09/18 1745)      Armour Villanueva DAVID

## 2018-10-09 NOTE — Transfer of Care (Signed)
Immediate Anesthesia Transfer of Care Note  Patient: Alicia Valdez  Procedure(s) Performed: right lumbar four-five reexploration for cerebrospinal fluid leak (N/A )  Patient Location: PACU  Anesthesia Type:General  Level of Consciousness: awake, drowsy and patient cooperative  Airway & Oxygen Therapy: Patient Spontanous Breathing and Patient connected to face mask oxygen  Post-op Assessment: Report given to RN and Post -op Vital signs reviewed and stable  Post vital signs: Reviewed and stable  Last Vitals:  Vitals Value Taken Time  BP 166/96 10/09/18 1520  Temp 36.4 C 10/09/18 1520  Pulse 75 10/09/18 1521  Resp 13 10/09/18 1521  SpO2 97 % 10/09/18 1521  Vitals shown include unvalidated device data.  Last Pain:  Vitals:   10/09/18 1105  TempSrc:   PainSc: 7       Patients Stated Pain Goal: 3 (32/95/18 8416)  Complications: No apparent anesthesia complications

## 2018-10-10 ENCOUNTER — Encounter (HOSPITAL_COMMUNITY): Payer: Self-pay

## 2018-10-10 ENCOUNTER — Other Ambulatory Visit: Payer: Self-pay

## 2018-10-10 MED ORDER — INFLUENZA VAC A&B SA ADJ QUAD 0.5 ML IM PRSY
0.5000 mL | PREFILLED_SYRINGE | INTRAMUSCULAR | Status: AC
  Administered 2018-10-15: 11:00:00 0.5 mL via INTRAMUSCULAR
  Filled 2018-10-10: qty 0.5

## 2018-10-10 NOTE — Progress Notes (Signed)
Patient ID: Alicia Valdez, female   DOB: January 07, 1935, 83 y.o.   MRN: [id number] BP (!) 98/49 (BP Location: Left Arm)   Pulse 73   Temp 98.4 F (36.9 C) (Oral)   Resp 16   Ht 5\' 2"  (1.575 m)   Wt 68.9 kg   SpO2 96%   BMI 27.80 kg/m  She states that her left lower extremity feels better, but that her right lower extremity is very weak. She is weak in a non anatomic fashion, the procedure was performed at L4/5. She is weak in her hip flexors, quads, but dorsiflexion and plantar flexion are stronger than the proximal musculature. Will send to MRI/ Wound is clean, and dry

## 2018-10-10 NOTE — Plan of Care (Signed)

## 2018-10-10 NOTE — Progress Notes (Signed)
Patient transferred from bed to chair. Needed 2 person assist, Knees buckled a few times when she was standing and she also c/o of diziness which resolved when she sat down. C/o weakness to Bil LE, R>L. Denies tingling or numbness to LE. States pain is tolerable with movement. Surgical incision CDI. Will cont to monitor.

## 2018-10-10 NOTE — Plan of Care (Signed)
  Problem: Activity: Goal: Ability to avoid complications of mobility impairment will improve Outcome: Progressing Goal: Ability to tolerate increased activity will improve Outcome: Progressing Goal: Will remain free from falls Outcome: Progressing   Problem: Pain Management: Goal: Pain level will decrease Outcome: Progressing   

## 2018-10-10 NOTE — Plan of Care (Signed)
  Problem: Activity: Goal: Ability to avoid complications of mobility impairment will improve Outcome: Progressing Goal: Ability to tolerate increased activity will improve Outcome: Progressing   Problem: Pain Management: Goal: Pain level will decrease Outcome: Progressing   

## 2018-10-11 DIAGNOSIS — K9189 Other postprocedural complications and disorders of digestive system: Secondary | ICD-10-CM | POA: Diagnosis not present

## 2018-10-11 DIAGNOSIS — Z419 Encounter for procedure for purposes other than remedying health state, unspecified: Secondary | ICD-10-CM | POA: Diagnosis not present

## 2018-10-11 DIAGNOSIS — K219 Gastro-esophageal reflux disease without esophagitis: Secondary | ICD-10-CM | POA: Diagnosis not present

## 2018-10-11 DIAGNOSIS — I10 Essential (primary) hypertension: Secondary | ICD-10-CM | POA: Diagnosis not present

## 2018-10-11 DIAGNOSIS — G970 Cerebrospinal fluid leak from spinal puncture: Secondary | ICD-10-CM | POA: Diagnosis not present

## 2018-10-11 DIAGNOSIS — Z23 Encounter for immunization: Secondary | ICD-10-CM | POA: Diagnosis present

## 2018-10-11 DIAGNOSIS — K567 Ileus, unspecified: Secondary | ICD-10-CM | POA: Diagnosis not present

## 2018-10-11 DIAGNOSIS — M7989 Other specified soft tissue disorders: Secondary | ICD-10-CM | POA: Diagnosis not present

## 2018-10-11 DIAGNOSIS — G9609 Other spinal cerebrospinal fluid leak: Secondary | ICD-10-CM | POA: Diagnosis present

## 2018-10-11 DIAGNOSIS — K5903 Drug induced constipation: Secondary | ICD-10-CM | POA: Diagnosis not present

## 2018-10-11 DIAGNOSIS — Z20828 Contact with and (suspected) exposure to other viral communicable diseases: Secondary | ICD-10-CM | POA: Diagnosis present

## 2018-10-11 DIAGNOSIS — Z79899 Other long term (current) drug therapy: Secondary | ICD-10-CM | POA: Diagnosis not present

## 2018-10-11 DIAGNOSIS — Z888 Allergy status to other drugs, medicaments and biological substances status: Secondary | ICD-10-CM | POA: Diagnosis not present

## 2018-10-11 DIAGNOSIS — G8918 Other acute postprocedural pain: Secondary | ICD-10-CM | POA: Diagnosis not present

## 2018-10-11 DIAGNOSIS — N179 Acute kidney failure, unspecified: Secondary | ICD-10-CM | POA: Diagnosis not present

## 2018-10-11 DIAGNOSIS — E876 Hypokalemia: Secondary | ICD-10-CM | POA: Diagnosis not present

## 2018-10-11 DIAGNOSIS — F329 Major depressive disorder, single episode, unspecified: Secondary | ICD-10-CM | POA: Diagnosis not present

## 2018-10-11 DIAGNOSIS — Z8249 Family history of ischemic heart disease and other diseases of the circulatory system: Secondary | ICD-10-CM | POA: Diagnosis not present

## 2018-10-11 DIAGNOSIS — Z96651 Presence of right artificial knee joint: Secondary | ICD-10-CM | POA: Diagnosis present

## 2018-10-11 DIAGNOSIS — Z833 Family history of diabetes mellitus: Secondary | ICD-10-CM | POA: Diagnosis not present

## 2018-10-11 DIAGNOSIS — G479 Sleep disorder, unspecified: Secondary | ICD-10-CM | POA: Diagnosis not present

## 2018-10-11 MED ORDER — PANTOPRAZOLE SODIUM 20 MG PO TBEC
20.0000 mg | DELAYED_RELEASE_TABLET | Freq: Two times a day (BID) | ORAL | Status: DC | PRN
  Filled 2018-10-11 (×2): qty 1

## 2018-10-11 MED ORDER — ALUM & MAG HYDROXIDE-SIMETH 200-200-20 MG/5ML PO SUSP
30.0000 mL | ORAL | Status: DC | PRN
  Administered 2018-10-11 – 2018-10-14 (×4): 30 mL via ORAL
  Filled 2018-10-11 (×5): qty 30

## 2018-10-11 NOTE — Progress Notes (Signed)
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 PMH including epidural causing the leak in July, blood patches in August and October, R TKA, and hiatal hernia.    Clinical Impression   PTA, pt lived with husband in multi-level home, and was independent for ADLs and IADLs. Pt currently presenting with decreased sensation, ROM, activity tolerance, strength, balance, and increased pain and nausea. Pt demonstrated increased motivation to participate in therapy despite nausea and pain. Provided education regarding back precautions and log rolling method for bed mobility. Pt performed functional transfers with min-mod A +2 for safety and balance. Pt requires max A +2 for LB ADLs, and supervision for UB ADLs. Pt will require OT services acutely to address deficits and increase safety and balance. Recommend dc to CIR to increase safe performance of ADLs and return to prior level of function.    Follow Up Recommendations  CIR    Equipment Recommendations  Other (comment);3 in 1 bedside commode(defer to next venue )    Recommendations for Other Services PT consult     Precautions / Restrictions Precautions Precautions: Back Precaution Booklet Issued: Yes (comment) Precaution Comments: reviewed all precautions; pt verbalized understanding Restrictions Weight Bearing Restrictions: No      Mobility Bed Mobility Overal bed mobility: Needs Assistance Bed Mobility: Rolling;Sidelying to Sit Rolling: Mod assist;+2 for physical assistance;+2 for safety/equipment Sidelying to sit: Mod assist       General bed mobility comments: Pt requires mod A +2 for safety   Transfers Overall transfer level: Needs assistance Equipment used: Rolling walker (2 wheeled) Transfers: Sit to/from [employer] Transfers Sit  to Stand: +2 physical assistance;+2 safety/equipment;Mod assist Stand pivot transfers: Min assist;+2 physical assistance;+2 safety/equipment       General transfer comment: Pt required minA-Mod A +2 for safety and balance    Balance Overall balance assessment: Mild deficits observed, not formally tested                                         ADL either performed or assessed with clinical judgement   ADL Overall ADL's : Needs assistance/impaired Eating/Feeding: Independent;Sitting   Grooming: Independent;Sitting   Upper Body Bathing: Supervision/ safety;Sitting   Lower Body Bathing: Sit to/from stand;Maximal assistance;+2 for physical assistance;+2 for safety/equipment   Upper Body Dressing : Supervision/safety;Sitting   Lower Body Dressing: Maximal assistance;+2 for physical assistance;+2 for safety/equipment;Sit to/from stand Lower Body Dressing Details (indicate cue type and reason): Pt requires max A +2 for safety and balance Toilet Transfer: Minimal assistance;+2 for physical assistance;+2 for safety/equipment;Ambulation;RW(simulated to recliner) Toilet Transfer Details (indicate cue type and reason): min A +2 for safety and balance Toileting- Clothing Manipulation and Hygiene: Maximal assistance;+2 for physical assistance;+2 for safety/equipment;Sit to/from stand Toileting - Clothing Manipulation Details (indicate cue type and reason): Pt requires max A +2 for safety and balance     Functional mobility during ADLs: Minimal assistance;+2 for physical assistance;+2 for safety/equipment;Rolling walker General ADL Comments: Pt requires supervision for some ADLs, and max A +2 for LB ADLs.      Vision Baseline Vision/History: Wears glasses Wears Glasses: Reading only Patient Visual Report: No change from baseline       Perception     Praxis  Pertinent Vitals/Pain Pain Assessment: No/denies pain     Hand Dominance Right   Extremity/Trunk  Assessment Upper Extremity Assessment Upper Extremity Assessment: Overall WFL for tasks assessed   Lower Extremity Assessment Lower Extremity Assessment: RLE deficits/detail RLE Deficits / Details: Pt reports difficulty moving RLE and a difference in sensation. Reports feeling pressure, but it does not feel normal in comparison to LLE.  RLE Sensation: decreased light touch RLE Coordination: decreased gross motor   Cervical / Trunk Assessment Cervical / Trunk Assessment: Other exceptions Cervical / Trunk Exceptions: s/p back surgery   Communication Communication Communication: No difficulties   Cognition Arousal/Alertness: Awake/alert Behavior During Therapy: WFL for tasks assessed/performed Overall Cognitive Status: Within Functional Limits for tasks assessed                                     General Comments       Exercises     Shoulder Instructions      Home Living Family/patient expects to be discharged to:: Private residence Living Arrangements: Spouse/significant other Available Help at Discharge: Family;Available 24 hours/day Type of Home: House       Home Layout: Multi-level;Able to live on main level with bedroom/bathroom Alternate Level Stairs-Number of Steps: Flight with chair lift   Bathroom Shower/Tub: Walk-in shower;Tub only   Bathroom Toilet: Standard     Home Equipment: Shower seat;Hand held shower head;Cane - quad;Walker - 4 wheels          Prior Functioning/Environment Level of Independence: Independent        Comments: Enjoys playing the piano        OT Problem List: Decreased strength;Decreased range of motion;Decreased activity tolerance;Impaired balance (sitting and/or standing);Decreased knowledge of use of DME or AE;Decreased knowledge of precautions;Decreased safety awareness;Pain;Impaired sensation      OT Treatment/Interventions: Self-care/ADL training;DME and/or AE instruction;Therapeutic  activities;Patient/family education;Balance training    OT Goals(Current goals can be found in the care plan section) Acute Rehab OT Goals Patient Stated Goal: Get back to normal OT Goal Formulation: With patient Time For Goal Achievement: 10/25/18 Potential to Achieve Goals: Good  OT Frequency: Min 2X/week   Barriers to D/C:            Co-evaluation              AM-PAC OT "6 Clicks" Daily Activity     Outcome Measure Help from another person eating meals?: None Help from another person taking care of personal grooming?: None Help from another person toileting, which includes using toliet, bedpan, or urinal?: A Lot Help from another person bathing (including washing, rinsing, drying)?: A Lot Help from another person to put on and taking off regular upper body clothing?: None Help from another person to put on and taking off regular lower body clothing?: A Lot 6 Click Score: 18   End of Session Equipment Utilized During Treatment: Gait belt;Rolling walker Nurse Communication: Mobility status  Activity Tolerance: Patient tolerated treatment well;Other (comment)(Limited by nausea) Patient left: in chair;with call bell/phone within reach  OT Visit Diagnosis: Unsteadiness on feet (R26.81);Other abnormalities of gait and mobility (R26.89);Muscle weakness (generalized) (M62.81);Other symptoms and signs involving the nervous system (R29.898);Pain Pain - Right/Left: Right Pain - part of body: Leg(surgical site)                Time: 6063-0160 OT Time Calculation (min): 32 min Charges:  OT General Charges $OT Visit: 1  Visit OT Evaluation $OT Eval Moderate Complexity: 1 Mod OT Treatments $Self Care/Home Management : 8-22 mins  Sandrea HammondJoAnna Alnita Aybar, OT Student  Sandrea HammondJoAnna Liandra Mendia 10/11/2018, 4:43 PM

## 2018-10-11 NOTE — Plan of Care (Signed)
  Problem: Education: Goal: Ability to verbalize activity precautions or restrictions will improve Outcome: Progressing Goal: Knowledge of the prescribed therapeutic regimen will improve Outcome: Progressing Goal: Understanding of discharge needs will improve Outcome: Progressing   Problem: Activity: Goal: Ability to avoid complications of mobility impairment will improve Outcome: Progressing Goal: Ability to tolerate increased activity will improve Outcome: Progressing Goal: Will remain free from falls Outcome: Progressing   

## 2018-10-11 NOTE — Progress Notes (Signed)
Patient ID: Alicia Valdez, female   DOB: 1935/05/01, 83 y.o.   MRN: [id number] BP 115/65 (BP Location: Right Arm)   Pulse 79   Temp 97.9 F (36.6 C) (Oral)   Resp 18   Ht 5\' 2"  (1.575 m)   Wt 68.9 kg   SpO2 96%   BMI 27.80 kg/m  Today states she is moving better. Will cancel the mri I have ordered pt, ot Wound is clean, dry, no signs of headache Improved.

## 2018-10-12 MED ORDER — MAGNESIUM CITRATE PO SOLN
1.0000 | Freq: Once | ORAL | Status: AC
  Administered 2018-10-12: 12:00:00 1 via ORAL
  Filled 2018-10-12: qty 296

## 2018-10-12 MED ORDER — SORBITOL 70 % SOLN
30.0000 mL | Freq: Every day | Status: DC | PRN
  Administered 2018-10-13 – 2018-10-14 (×2): 30 mL via ORAL
  Filled 2018-10-12 (×2): qty 30

## 2018-10-12 MED ORDER — POLYETHYLENE GLYCOL 3350 17 G PO PACK
17.0000 g | PACK | Freq: Every day | ORAL | Status: DC | PRN
  Administered 2018-10-12 – 2018-10-14 (×3): 17 g via ORAL
  Filled 2018-10-12 (×3): qty 1

## 2018-10-12 MED ORDER — DIPHENHYDRAMINE HCL 25 MG PO CAPS
25.0000 mg | ORAL_CAPSULE | Freq: Four times a day (QID) | ORAL | Status: DC | PRN
  Administered 2018-10-12: 12:00:00 25 mg via ORAL
  Administered 2018-10-12: 22:00:00 50 mg via ORAL
  Administered 2018-10-12 – 2018-10-15 (×3): 25 mg via ORAL
  Filled 2018-10-12 (×3): qty 1
  Filled 2018-10-12: qty 2
  Filled 2018-10-12: qty 1

## 2018-10-12 NOTE — Plan of Care (Signed)
  Problem: Activity: Goal: Ability to avoid complications of mobility impairment will improve Outcome: Progressing Goal: Will remain free from falls Outcome: Progressing   Problem: Pain Management: Goal: Pain level will decrease Outcome: Progressing   

## 2018-10-12 NOTE — Evaluation (Signed)
Physical Therapy Evaluation Patient Details Name: Alicia Valdez MRN: [id number] DOB: 07/13/1935 Today's Date: 10/12/2018   History of Present Illness  Pt is 83 yo female s/p R L4-5 re-exploration for CSF leak from spinal puncture. PMH including epidural causing the leak in July, blood patches in August and October, R TKA, and hiatal hernia.   Clinical Impression  Patient is s/p above surgery resulting in functional limitations due to the deficits listed below (see PT Problem List). Managing independently prior to admission; Had relatively recently started using a cane or RW due to back pain; Presents with decr strength bil LEs (R weaker than L), decr functional mobility, decr activity tolerance; Very motivated to get better -- opted to work with PT even though she had been nauseated most of the morning; Agree with CIR for post-acute rehab;  Patient will benefit from skilled PT to increase their independence and safety with mobility to allow discharge to the venue listed below.       Follow Up Recommendations CIR    Equipment Recommendations  Rolling walker with 5" wheels;3in1 (PT)    Recommendations for Other Services       Precautions / Restrictions Precautions Precautions: Back Precaution Booklet Issued: Yes (comment) Precaution Comments: reviewed all precautions; pt verbalized understanding      Mobility  Bed Mobility Overal bed mobility: Needs Assistance Bed Mobility: Rolling;Sidelying to Sit Rolling: Min assist Sidelying to sit: Mod assist       General bed mobility comments: Cues and close guard for back prec; light mod assist to elevate trunk to sit  Transfers Overall transfer level: Needs assistance   Transfers: Sit to/from Stand Sit to Stand: +2 physical assistance;+2 safety/equipment;Mod assist         General transfer comment: Mod assist to help power up; stood from elevated bed, and then from higher stedy seat to back to bed  Ambulation/Gait                 Stairs            Wheelchair Mobility    Modified Rankin (Stroke Patients Only)       Balance Overall balance assessment: Needs assistance Sitting-balance support: Bilateral upper extremity supported Sitting balance-Leahy Scale: Fair       Standing balance-Leahy Scale: Poor                               Pertinent Vitals/Pain Pain Assessment: Faces Faces Pain Scale: Hurts a little bit Pain Location: back pain/soreness; She is mostly concerned about persistent itching Pain Descriptors / Indicators: Aching;Grimacing Pain Intervention(s): Monitored during session;Other (comment)(Notified RN re: itching)    Home Living Family/patient expects to be discharged to:: Private residence Living Arrangements: Spouse/significant other Available Help at Discharge: Family;Available 24 hours/day Type of Home: House       Home Layout: Multi-level;Able to live on main level with bedroom/bathroom Home Equipment: Shower seat;Hand held shower head;Cane - quad;Walker - 4 wheels      Prior Function Level of Independence: Independent with assistive device(s)         Comments: Enjoys playing the piano. Started using cane and walker since back pain     Hand Dominance   Dominant Hand: Right    Extremity/Trunk Assessment   Upper Extremity Assessment Upper Extremity Assessment: Defer to OT evaluation    Lower Extremity Assessment Lower Extremity Assessment: Generalized weakness;RLE deficits/detail RLE Deficits / Details: Pt reports difficulty moving  RLE and a difference in sensation. Reports feeling pressure, but it does not feel normal in comparison to LLE. Ankle dorsiflexion2/5, knee extension 2+/5 RLE Sensation: decreased light touch RLE Coordination: decreased gross motor    Cervical / Trunk Assessment Cervical / Trunk Assessment: Other exceptions Cervical / Trunk Exceptions: s/p back surgery  Communication   Communication: No difficulties   Cognition Arousal/Alertness: Awake/alert Behavior During Therapy: WFL for tasks assessed/performed Overall Cognitive Status: Within Functional Limits for tasks assessed                                 General Comments: Very motivated      General Comments General comments (skin integrity, edema, etc.): Reported dizziness with upright activity; BP 108/53; dizziness subsided once laying back down    Exercises     Assessment/Plan    PT Assessment Patient needs continued PT services  PT Problem List Decreased strength;Decreased range of motion;Decreased activity tolerance;Decreased balance;Decreased mobility;Decreased coordination;Decreased knowledge of use of DME;Decreased knowledge of precautions;Impaired sensation;Pain       PT Treatment Interventions DME instruction;Gait training;Functional mobility training;Therapeutic activities;Therapeutic exercise;Stair training;Balance training;Neuromuscular re-education;Patient/family education    PT Goals (Current goals can be found in the Care Plan section)  Acute Rehab PT Goals Patient Stated Goal: Get back to normal PT Goal Formulation: With patient Time For Goal Achievement: 10/26/18 Potential to Achieve Goals: Good    Frequency Min 4X/week   Barriers to discharge        Co-evaluation               AM-PAC PT "6 Clicks" Mobility  Outcome Measure Help needed turning from your back to your side while in a flat bed without using bedrails?: A Little Help needed moving from lying on your back to sitting on the side of a flat bed without using bedrails?: A Lot Help needed moving to and from a bed to a chair (including a wheelchair)?: A Lot Help needed standing up from a chair using your arms (e.g., wheelchair or bedside chair)?: A Lot Help needed to walk in hospital room?: A Lot Help needed climbing 3-5 steps with a railing? : Total 6 Click Score: 12    End of Session Equipment Utilized During Treatment:  Other (comment)(stedy) Activity Tolerance: Patient limited by fatigue;Other (comment)(and recent nausea/vomiting) Patient left: in bed;with call bell/phone within reach;with bed alarm set Nurse Communication: Mobility status PT Visit Diagnosis: Unsteadiness on feet (R26.81);Other abnormalities of gait and mobility (R26.89);Muscle weakness (generalized) (M62.81)    Time: 6967-8938 PT Time Calculation (min) (ACUTE ONLY): 27 min   Charges:   PT Evaluation $PT Eval Moderate Complexity: 1 Mod PT Treatments $Therapeutic Activity: 8-22 mins        Van Clines, PT  Acute Rehabilitation Services Pager [phone] Office [phone]   Levi Aland 10/12/2018, 12:45 PM

## 2018-10-12 NOTE — Progress Notes (Signed)
Rehab Admissions Coordinator Note:  Patient was screened by Michel Santee for appropriateness for an Inpatient Acute Rehab Consult.  At this time, we are recommending Inpatient Rehab consult. Please place a consult order if pt would like to be considered for CIR.    Michel Santee 10/12/2018, 10:29 AM  I can be reached at [phone].

## 2018-10-12 NOTE — Progress Notes (Signed)
Patient ID: Alicia Valdez, female   DOB: 1935-05-12, 83 y.o.   MRN: [id number] BP 109/69 (BP Location: Right Arm)   Pulse 68   Temp 97.8 F (36.6 C) (Oral)   Resp 18   Ht 5\' 2"  (1.575 m)   Wt 68.9 kg   SpO2 96%   BMI 27.80 kg/m  Alert and oriented Moving lower extremities Wound is clean, and dry Constipated, have given options mgcitrate ordered Recommended for inpatient rehab

## 2018-10-12 NOTE — Plan of Care (Signed)
  Problem: Activity: Goal: Ability to avoid complications of mobility impairment will improve Outcome: Progressing   

## 2018-10-13 ENCOUNTER — Encounter (HOSPITAL_COMMUNITY): Payer: Self-pay | Admitting: Neurosurgery

## 2018-10-13 NOTE — Plan of Care (Signed)
  Problem: Education: Goal: Ability to verbalize activity precautions or restrictions will improve Outcome: Progressing   Problem: Activity: Goal: Ability to avoid complications of mobility impairment will improve Outcome: Progressing   Problem: Pain Management: Goal: Pain level will decrease Outcome: Progressing   Problem: Pain Managment: Goal: General experience of comfort will improve Outcome: Progressing   Problem: Safety: Goal: Ability to remain free from injury will improve Outcome: Progressing

## 2018-10-13 NOTE — Progress Notes (Signed)
Occupational Therapy Treatment Patient Details Name: Alicia Valdez MRN: [id number] DOB: 1935-12-15 Today's Date: 10/13/2018    History of present illness Pt is 83 yo female s/p R L4-5 re-exploration for CSF leak from spinal puncture. PMH including epidural causing the leak in July, blood patches in August and October, R TKA, and hiatal hernia.    OT comments  Pt making steady progress towards OT goals this session. Pt MOD A stand pivot transfer from EOB >BSC. Pt requires increased assist to advance RLE during functional mobility. Additionally, pt presenting with flexed posture requiring cues to stand upright during transfer. Pt able to complete anterior pericare with set-up utilizing lateral leans.  Reviewed back precautions extensively and provided functional examples with pt able to verbalize correct body mechanics needed for each functional example. Pt supervision for seated UB ADL; MAX A LB ADL d/t back precautions. DC plan remains appropriate. Will continue to follow acutely for OT needs.    Follow Up Recommendations  CIR    Equipment Recommendations  Other (comment);3 in 1 bedside commode    Recommendations for Other Services      Precautions / Restrictions Precautions Precautions: Back Precaution Booklet Issued: Yes (comment) Precaution Comments: reviewed all precautions; pt verbalized understanding Restrictions Weight Bearing Restrictions: No       Mobility Bed Mobility Overal bed mobility: Needs Assistance Bed Mobility: Rolling;Sidelying to Sit Rolling: Min assist Sidelying to sit: Mod assist       General bed mobility comments: cues to initiate log roll; MOD A to elevate trunk and scoot hips forward  Transfers Overall transfer level: Needs assistance Equipment used: Rolling walker (2 wheeled) Transfers: Sit to/from Stand Sit to Stand: Min assist;Mod assist;From elevated surface Stand pivot transfers: Mod assist       General transfer comment: sit>stand x 4  trials; MOD A initailly MIN A by end of session; pt stands with flexed posture; cues to stand tall during transfer; Pt requires MOD assist to advance RLE during transfer    Balance Overall balance assessment: Needs assistance Sitting-balance support: Feet supported Sitting balance-Leahy Scale: Fair Sitting balance - Comments: able to static sit EOB   Standing balance support: Bilateral upper extremity supported Standing balance-Leahy Scale: Poor Standing balance comment: reliant on BUE support in standing                           ADL either performed or assessed with clinical judgement   ADL       Grooming: Wash/dry face;Sitting;Set up               Lower Body Dressing: Total assistance;Sitting/lateral leans Lower Body Dressing Details (indicate cue type and reason): total A d/t back precautions Toilet Transfer: Moderate assistance;BSC;RW;Stand-pivot Toilet Transfer Details (indicate cue type and reason): MOD A to advance RLE during stand pivot transfer; pt with flexed posture throuhgout transfer; verbal/ tactile cues to self- correct Toileting- Clothing Manipulation and Hygiene: Set up;Sitting/lateral lean Toileting - Clothing Manipulation Details (indicate cue type and reason): able to complete sitting anterior pericare using lateral leans from recliner     Functional mobility during ADLs: Minimal assistance;+2 for physical assistance;+2 for safety/equipment;Rolling walker General ADL Comments: supervision UB ADL; MAX-Total LB ADL     Vision Baseline Vision/History: Wears glasses Wears Glasses: Reading only Patient Visual Report: No change from baseline     Perception     Praxis      Cognition Arousal/Alertness: Awake/alert Behavior During Therapy: [facility] for  tasks assessed/performed Overall Cognitive Status: Within Functional Limits for tasks assessed                                 General Comments: feeling slightly frustrated about the  fact that she hasn't walked yet        Exercises     Shoulder Instructions       General Comments reported dizziness sitting EOB: BP checked 117/103    Pertinent Vitals/ Pain       Faces Pain Scale: Hurts even more Pain Location: back pain/soreness; Pain Descriptors / Indicators: Aching;Grimacing Pain Intervention(s): Limited activity within patient's tolerance;Monitored during session;Repositioned  Home Living                                          Prior Functioning/Environment              Frequency  Min 2X/week        Progress Toward Goals  OT Goals(current goals can now be found in the care plan section)  Progress towards OT goals: Progressing toward goals  Acute Rehab OT Goals Patient Stated Goal: Get back to normal OT Goal Formulation: With patient Time For Goal Achievement: 10/25/18 Potential to Achieve Goals: Good  Plan Discharge plan remains appropriate    Co-evaluation                 AM-PAC OT "6 Clicks" Daily Activity     Outcome Measure   Help from another person eating meals?: None Help from another person taking care of personal grooming?: None Help from another person toileting, which includes using toliet, bedpan, or urinal?: A Little Help from another person bathing (including washing, rinsing, drying)?: A Lot Help from another person to put on and taking off regular upper body clothing?: None Help from another person to put on and taking off regular lower body clothing?: A Lot 6 Click Score: 19    End of Session Equipment Utilized During Treatment: Gait belt;Rolling walker  OT Visit Diagnosis: Unsteadiness on feet (R26.81);Other abnormalities of gait and mobility (R26.89);Muscle weakness (generalized) (M62.81);Other symptoms and signs involving the nervous system (R29.898);Pain Pain - Right/Left: Right Pain - part of body: Leg   Activity Tolerance Patient tolerated treatment well;Other (comment)(limited  by nausea)   Patient Left in chair;with call bell/phone within reach   Nurse Communication Mobility status;Other (comment)(dizzness; needs new pure wic)        Time: 7322-0254 OT Time Calculation (min): 42 min  Charges: OT General Charges $OT Visit: 1 Visit OT Treatments $Self Care/Home Management : 23-37 mins $Therapeutic Activity: 8-22 mins  [place], [place] Acute Rehabilitation Services [phone] [place] 10/13/2018, 2:23 PM

## 2018-10-13 NOTE — Progress Notes (Signed)
Physical Therapy Treatment Patient Details Name: Alicia Valdez MRN: [id number] DOB: [birth year] Today's Date: 10/13/2018    History of Present Illness Pt is 83 yo female s/p R L4-5 re-exploration for CSF leak from spinal puncture. PMH including epidural causing the leak in July, blood patches in August and October, R TKA, and hiatal hernia.     PT Comments    Continuing work on functional mobility and activity tolerance;  Able to initiate gait training today with 2 person assist for safety and chair pushed behind; Poor balance, with difficulty organizing and controlling trunk, cues to push down into RW for stability; Requires assist to advance RLE and noted R knee flexed throughout stance -- will consider trying prefab AFO for dorsiflexion assist and knee control; If good success, will seek Orthotist Consult for custom AFO RLE   Follow Up Recommendations  CIR     Equipment Recommendations  Rolling walker with 5" wheels;3in1 (PT)    Recommendations for Other Services       Precautions / Restrictions Precautions Precautions: Back Precaution Booklet Issued: Yes (comment) Precaution Comments: reviewed all precautions; pt verbalized understanding, but noted some difficulty putting precautions into practice with functional mobility Required Braces or Orthoses: Other Brace Other Brace: Will consider trying prefabricated aFO next session Restrictions Weight Bearing Restrictions: No    Mobility  Bed Mobility Overal bed mobility: Needs Assistance Bed Mobility: Rolling;Sidelying to Sit Rolling: Min assist Sidelying to sit: Mod assist       General bed mobility comments: cues to initiate log roll; MOD A to elevate trunk and scoot hips forward  Transfers Overall transfer level: Needs assistance Equipment used: Rolling walker (2 wheeled) Transfers: Sit to/from Stand Sit to Stand: Min assist;Mod assist;From elevated surface Stand pivot transfers: Mod assist       General transfer  comment: Mod assist to power up; cues for hand placement and safety; mod assist to control descent to sit  Ambulation/Gait Ambulation/Gait assistance: Mod assist;+2 safety/equipment Gait Distance (Feet): 5 Feet Assistive device: Rolling walker (2 wheeled) Gait Pattern/deviations: Step-through pattern;Ataxic     General Gait Details: Mod assist to advance RLE; Difficulty organizing trunk, with weight shift fluctuations and consistent minor losses of balance; at times, RW coming off of the floor; Noted R knee in slightly flexed position throughout stance   Stairs             Wheelchair Mobility    Modified Rankin (Stroke Patients Only)       Balance Overall balance assessment: Needs assistance Sitting-balance support: Feet supported Sitting balance-Leahy Scale: Fair Sitting balance - Comments: able to static sit EOB   Standing balance support: Bilateral upper extremity supported Standing balance-Leahy Scale: Poor Standing balance comment: reliant on BUE support in standing                            Cognition Arousal/Alertness: Awake/alert Behavior During Therapy: WFL for tasks assessed/performed Overall Cognitive Status: Within Functional Limits for tasks assessed                                 General Comments: feeling slightly frustrated about the fact that she hasn't walked yet      Exercises      General Comments General comments (skin integrity, edema, etc.): reported dizziness sitting EOB: BP checked 117/103      Pertinent Vitals/Pain Pain Assessment: Faces Faces Pain  Scale: Hurts little more Pain Location: back pain/soreness; Pain Descriptors / Indicators: Aching;Grimacing Pain Intervention(s): Monitored during session    Home Living                      Prior Function            PT Goals (current goals can now be found in the care plan section) Acute Rehab PT Goals Patient Stated Goal: Get back to  normal PT Goal Formulation: With patient Time For Goal Achievement: 10/26/18 Potential to Achieve Goals: Good Progress towards PT goals: Progressing toward goals    Frequency    Min 4X/week      PT Plan Current plan remains appropriate    Co-evaluation              AM-PAC PT "6 Clicks" Mobility   Outcome Measure  Help needed turning from your back to your side while in a flat bed without using bedrails?: A Little Help needed moving from lying on your back to sitting on the side of a flat bed without using bedrails?: A Lot Help needed moving to and from a bed to a chair (including a wheelchair)?: A Lot Help needed standing up from a chair using your arms (e.g., wheelchair or bedside chair)?: A Lot Help needed to walk in hospital room?: A Lot Help needed climbing 3-5 steps with a railing? : Total 6 Click Score: 12    End of Session Equipment Utilized During Treatment: Gait belt Activity Tolerance: Patient tolerated treatment well Patient left: in chair;with call bell/phone within reach;with chair alarm set Nurse Communication: Mobility status PT Visit Diagnosis: Unsteadiness on feet (R26.81);Other abnormalities of gait and mobility (R26.89);Muscle weakness (generalized) (M62.81)     Time: 1412-1435(end time is approximate) PT Time Calculation (min) (ACUTE ONLY): 23 min  Charges:  $Gait Training: 8-22 mins $Therapeutic Activity: 8-22 mins                     Van Clines, PT  Acute Rehabilitation Services Pager [phone] Office [phone]    Levi Aland 10/13/2018, 3:52 PM

## 2018-10-13 NOTE — Progress Notes (Signed)
Patient ID: Alicia Valdez, female   DOB: [birth year], 83 y.o.   MRN: [id number] BP 120/60 (BP Location: Right Arm)   Pulse 89   Temp 98.2 F (36.8 C) (Oral)   Resp 18   Ht 5\' 2"  (1.575 m)   Wt 64 kg   SpO2 97%   BMI 25.81 kg/m  Alert and oriented x 4 Speech is clear and fluent Moving all extremities Order placed for CIR consult

## 2018-10-14 NOTE — Progress Notes (Signed)
Patient ID: Alicia Valdez, female   DOB: 10-31-1935, 83 y.o.   MRN: [id number] BP 131/61 (BP Location: Right Arm)   Pulse 73   Temp 98.4 F (36.9 C) (Oral)   Resp 18   Ht 5\' 2"  (1.575 m)   Wt 64 kg   SpO2 96%   BMI 25.81 kg/m  Alert, oriented x 4 Moving all extremities Will contact [employer] for afo right lower extremity Wound is clean, and dry

## 2018-10-14 NOTE — Progress Notes (Signed)
Physical Therapy Treatment Patient Details Name: Alicia Valdez MRN: [id number] DOB: 06-20-1935 Today's Date: 10/14/2018    History of Present Illness Pt is 83 yo female s/p R L4-5 re-exploration for CSF leak from spinal puncture. PMH including epidural causing the leak in July, blood patches in August and October, R TKA, and hiatal hernia.     PT Comments    Continuing work on functional mobility and activity tolerance;  Session focused on gait training with prefabricated AFO RLE to help with knee stability and dorsiflexion assist; We had good success with it -- far less need for assist with R foot advancement; Will contact Dr. Christella Noa re: getting an Orthotist Consult;   Continue to recommend comprehensive inpatient rehab (CIR) for post-acute therapy needs. Overall progressing well; Anticipate continuing good progress at post-acute rehabilitation.   Follow Up Recommendations  CIR     Equipment Recommendations  Rolling walker with 5" wheels;3in1 (PT)    Recommendations for Other Services       Precautions / Restrictions Precautions Precautions: Back Precaution Booklet Issued: Yes (comment) Precaution Comments: reviewed all precautions; pt verbalized understanding, but noted some difficulty putting precautions into practice with functional mobility Required Braces or Orthoses: Other Brace Other Brace: Success with prefab AFO trial    Mobility  Bed Mobility Overal bed mobility: Needs Assistance Bed Mobility: Rolling;Sidelying to Sit Rolling: Min assist Sidelying to sit: Mod assist       General bed mobility comments: cues to initiate log roll; MOD A to elevate trunk and scoot hips forward  Transfers Overall transfer level: Needs assistance Equipment used: Rolling walker (2 wheeled) Transfers: Sit to/from Stand Sit to Stand: Mod assist;Min assist         General transfer comment: Mod assist to stand from bed; min assist to stand from recliner -- armrests make the  difference, they give a stable platform for her to push up from  Ambulation/Gait Ambulation/Gait assistance: Min assist;Mod assist Gait Distance (Feet): 45 Feet(x2 with a seated rest break) Assistive device: Rolling walker (2 wheeled) Gait Pattern/deviations: Ataxic Gait velocity: slowed   General Gait Details: Much improved RLE advancement with AFO for dorsiflexion assist and better knee stability; did not need physical assist to advance RLE today; 2 instances of mod assist to steady because of losses of balance   Stairs             Wheelchair Mobility    Modified Rankin (Stroke Patients Only)       Balance     Sitting balance-Leahy Scale: Fair       Standing balance-Leahy Scale: Poor Standing balance comment: reliant on BUE support in standing                            Cognition Arousal/Alertness: Awake/alert Behavior During Therapy: WFL for tasks assessed/performed Overall Cognitive Status: Within Functional Limits for tasks assessed                                        Exercises      General Comments        Pertinent Vitals/Pain Pain Assessment: No/denies pain Pain Location: Heartburn Pain Descriptors / Indicators: Grimacing Pain Intervention(s): Monitored during session    Home Living         Home Access: Other (comment)(chair lift from garage into main level of home)  Home Layout: Multi-level;Able to live on main level with bedroom/bathroom;Full bath on main level(3 levels)   Additional Comments: initial CSF leak October 2019    Prior Function            PT Goals (current goals can now be found in the care plan section) Acute Rehab PT Goals Patient Stated Goal: Get back to normal PT Goal Formulation: With patient Time For Goal Achievement: 10/26/18 Potential to Achieve Goals: Good Progress towards PT goals: Progressing toward goals    Frequency    Min 4X/week      PT Plan Current plan  remains appropriate    Co-evaluation              AM-PAC PT "6 Clicks" Mobility   Outcome Measure  Help needed turning from your back to your side while in a flat bed without using bedrails?: A Little Help needed moving from lying on your back to sitting on the side of a flat bed without using bedrails?: A Lot Help needed moving to and from a bed to a chair (including a wheelchair)?: A Lot Help needed standing up from a chair using your arms (e.g., wheelchair or bedside chair)?: A Little Help needed to walk in hospital room?: A Lot Help needed climbing 3-5 steps with a railing? : A Lot 6 Click Score: 14    End of Session Equipment Utilized During Treatment: Gait belt;Other (comment)(R LE AFO) Activity Tolerance: Patient tolerated treatment well Patient left: in bed;with call bell/phone within reach Nurse Communication: Mobility status PT Visit Diagnosis: Unsteadiness on feet (R26.81);Other abnormalities of gait and mobility (R26.89);Muscle weakness (generalized) (M62.81)     Time: 4315-4008 PT Time Calculation (min) (ACUTE ONLY): 33 min  Charges:  $Gait Training: 23-37 mins                     Van Clines, [place]  Acute Rehabilitation Services Pager [phone] Office [phone]    Levi Aland 10/14/2018, 4:15 PM

## 2018-10-14 NOTE — TOC Initial Note (Signed)
Transition of Care [facility]) - Initial/Assessment Note    Patient Details  Name: Alicia Valdez MRN: [id number] Date of Birth: 12-May-1935  Transition of Care [facility]) CM/SW Contact:    Midge Minium RN, BSN, NCM-BC, ACM-RN [phone] Phone Number: 10/14/2018, 11:08 AM  Clinical Narrative:                 CM following for dispositional needs. Pt is 83 yo female s/p R L4-5 re-exploration for CSF leak from spinal puncture. PT/OT eval completed with CIR recommended with the patient agreeable. IP Rehab consult requested and ordered to eval for appropriateness. CM team will continue to follow.   Expected Discharge Plan: IP Rehab Facility Barriers to Discharge: Continued Medical Work up   Patient Goals and CMS Choice Patient states their goals for this hospitalization and ongoing recovery are:: "get back to normal"   Choice offered to / list presented to : Patient(Discussed by Rehab [facility])  Expected Discharge Plan and Services Expected Discharge Plan: [place] In-house Referral: NA Discharge Planning Services: CM Consult Post Acute Care Choice: IP Rehab Living arrangements for the past 2 months: Single Family Home                    Prior Living Arrangements/Services Living arrangements for the past 2 months: Single Family Home Lives with:: Self, Spouse           Activities of Daily Living Home Assistive Devices/Equipment: Environmental consultant (specify type), Cane (specify quad or straight) ADL Screening (condition at time of admission) Patient's cognitive ability adequate to safely complete daily activities?: Yes Is the patient deaf or have difficulty hearing?: No Does the patient have difficulty seeing, even when wearing glasses/contacts?: No Does the patient have difficulty concentrating, remembering, or making decisions?: No Patient able to express need for assistance with ADLs?: Yes Does the patient have difficulty dressing or bathing?: No Independently performs ADLs?: Yes (appropriate  for developmental age) Does the patient have difficulty walking or climbing stairs?: Yes Weakness of Legs: Both Weakness of Arms/Hands: None   Admission diagnosis:  CSF leak at LP site [G97.0] Patient Active Problem List   Diagnosis Date Noted  . 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 GERD (gastroesophageal reflux disease) 10/29/2016  . Impaired mobility and activities of daily living 10/29/2016  . Iron deficiency anemia due to chronic blood loss 10/29/2016  . Essential hypertension 05/20/2015  . Leg weakness, bilateral 05/19/2015  . Transverse myelitis ([place]) 05/16/2015  . Paraesophageal hernia 11/12/2012  . Microcytic anemia 08/03/2012  . Hyperglycemia 08/03/2012  . C1 cervical fracture ([place]) 08/03/2012  . Atypical chest pain 08/03/2012  . Dysphagia 08/03/2012  . DDD (degenerative disc disease), lumbosacral 01/25/2012   PCP:  Patient, No Pcp Per Pharmacy:   [id number] - [place], [place] [address] [place] [address] Phone: [phone] Fax: [phone]     Social Determinants of Health (SDOH) Interventions    Readmission Risk Interventions No flowsheet data found.

## 2018-10-14 NOTE — PMR Pre-admission (Signed)
PMR Admission Coordinator Pre-Admission Assessment  Patient: Alicia Valdez is an 83 y.o., female MRN: [id number] DOB: 1935-02-19 Height: _0  (157.5 cm) Weight: 64 kg  Insurance Information HMO:     PPO: yes     PCP:      IPA:      80/20:      OTHER: medicare advantage plan PRIMARY: [employer]      Policy#: [id number]      Subscriber: pt CM Name: Jolayne Haines      Phone#: [phone] ext [phone]     Fax#: [phone] Pre-Cert#: [id number] approved until  10/21 with f/u  [place] phone ext [phone] and same fax     Employer:  Benefits:  Phone #: [phone]     Name: 10/13 Eff. Date: 01/01/2017     Deduct: none      Out of Pocket Max: [phone]      Life Max: none CIR: $450 co pay per day days 1 until 4      SNF: no co pay days 1 until 20; $178 co pay per days 21 until 100 Outpatient: $20 to $40 per visit     Co-Pay: visits per medical neccesity Home Health: 100%      Co-Pay: visits per medical neccesity DME: 805     Co-Pay: 205 Providers: in network  SECONDARY: none      Medicaid Application Date:       Case Manager:  Disability Application Date:       Case Worker:   The "Data Collection Information Summary" for patients in Inpatient Rehabilitation Facilities with attached "Privacy Act [place] Records" was provided and verbally reviewed with: Patient  Emergency Contact Information Contact Information    Name Relation Home Work [place] Spouse [phone]  [phone]      Current Medical History  Patient Admitting Diagnosis: debility, CSF leak; reexploration L4-5  History of Present Illness: 83 year old female with past medical history of arthritis, back surgery, skiing accident 15 years ago with multiple broken bones, pleurisy and transverse myelitis. Also Hiatal hernia, GERD, essential HTN, Iron deficiency anemia and lumbosacral DDD with history of kyphoplasty.  Presented on 10/09/2018 for evaluation of a CSF leak after pain management with epidural  steroid injections. Had injection July of 2019 and 2nd injection later that year. Has undergone blood patches both in August and October of 2019 with neither terminating the CSF leak. Underwent right lumbar 4 to 5 reexploration for CSF fluid leak with known CSF collection anterior to the sac at L4/5. Postoperatively with left lower extremity felt better, but right LE very weak at her hip flexors, quads, but dorsiflexion and plantar flexion stronger than the proximal musculature.   Patient[facility]  has been reviewed by the rehabilitation admission coordinator and physician.  Past Medical History  Past Medical History:  Diagnosis Date  . Anemia   . GERD (gastroesophageal reflux disease)   . Hypertension   . Transverse myelitis ([place]) 2016    Family History   family history is not on file.  Prior Rehab/Hospitalizations Has the patient had prior rehab or hospitalizations prior to admission? Yes [facility] 2017 after dx transverse Myelitis and [facility] 2018 for same  Has the patient had major surgery during 100 days prior to admission? Yes   Current Medications  Current Facility-Administered Medications:  .  0.9 %  sodium chloride infusion, 250 mL, Intravenous, Continuous, Cabbell, Kyle, MD .  0.9 % NaCl with  KCl 20 mEq/ L  infusion, , Intravenous, Continuous, Ashok Pall, MD .  acetaminophen (TYLENOL) tablet 650 mg, 650 mg, Oral, Q4H PRN, 650 mg at 10/13/18 1618 **OR** acetaminophen (TYLENOL) suppository 650 mg, 650 mg, Rectal, Q4H PRN, Ashok Pall, MD .  alum & mag hydroxide-simeth (MAALOX/MYLANTA) 200-200-20 MG/5ML suspension 30 mL, 30 mL, Oral, Q4H PRN, Ashok Pall, MD, 30 mL at 10/14/18 1418 .  amLODipine (NORVASC) tablet 10 mg, 10 mg, Oral, Daily, Cabbell, Kyle, MD, 10 mg at 10/15/18 1119 .  butalbital-acetaminophen-caffeine (FIORICET) 50-325-40 MG per tablet 1 tablet, 1 tablet, Oral, BID, Ashok Pall, MD, 1 tablet at 10/15/18 1120 .   celecoxib (CELEBREX) capsule 200 mg, 200 mg, Oral, Q12H, Ashok Pall, MD, 200 mg at 10/15/18 1120 .  cloNIDine (CATAPRES) tablet 0.2 mg, 0.2 mg, Oral, Q6H PRN, Ashok Pall, MD .  cyclobenzaprine (FLEXERIL) tablet 10 mg, 10 mg, Oral, TID PRN, Ashok Pall, MD .  desmopressin (DDAVP) tablet 0.1 mg, 0.1 mg, Oral, QHS, Ashok Pall, MD, 0.1 mg at 10/14/18 2213 .  diphenhydrAMINE (BENADRYL) capsule 25-50 mg, 25-50 mg, Oral, Q6H PRN, Ashok Pall, MD, 25 mg at 10/15/18 0153 .  irbesartan (AVAPRO) tablet 75 mg, 75 mg, Oral, Daily, 75 mg at 10/15/18 1120 **AND** hydrochlorothiazide (MICROZIDE) capsule 12.5 mg, 12.5 mg, Oral, Daily, Ashok Pall, MD, 12.5 mg at 10/15/18 1119 .  menthol-cetylpyridinium (CEPACOL) lozenge 3 mg, 1 lozenge, Oral, PRN **OR** phenol (CHLORASEPTIC) mouth spray 1 spray, 1 spray, Mouth/Throat, PRN, Ashok Pall, MD .  ondansetron (ZOFRAN) tablet 4 mg, 4 mg, Oral, Q6H PRN, 4 mg at 10/14/18 0822 **OR** ondansetron (ZOFRAN) injection 4 mg, 4 mg, Intravenous, Q6H PRN, Ashok Pall, MD, 4 mg at 10/14/18 1416 .  oxyCODONE (Oxy IR/ROXICODONE) immediate release tablet 10 mg, 10 mg, Oral, Q3H PRN, Ashok Pall, MD, 10 mg at 10/12/18 1933 .  oxyCODONE (Oxy IR/ROXICODONE) immediate release tablet 5 mg, 5 mg, Oral, Q3H PRN, Ashok Pall, MD, 5 mg at 10/15/18 1349 .  pantoprazole (PROTONIX) EC tablet 20 mg, 20 mg, Oral, BID PRN, Ashok Pall, MD .  polyethylene glycol (MIRALAX / GLYCOLAX) packet 17 g, 17 g, Oral, Daily PRN, Ashok Pall, MD, 17 g at 10/14/18 2212 .  sodium chloride flush (NS) 0.9 % injection 3 mL, 3 mL, Intravenous, Q12H, Ashok Pall, MD, 3 mL at 10/15/18 1121 .  sodium chloride flush (NS) 0.9 % injection 3 mL, 3 mL, Intravenous, PRN, Ashok Pall, MD .  sorbitol 70 % solution 30 mL, 30 mL, Oral, Daily PRN, Ashok Pall, MD, 30 mL at 10/14/18 2212  Patients Current Diet:  Diet Order            Diet regular Room service appropriate? Yes; Fluid consistency: Thin   Diet effective now              Precautions / Restrictions Precautions Precautions: Back Precaution Booklet Issued: Yes (comment) Precaution Comments: reviewed all precautions; pt verbalized understanding, but noted some difficulty putting precautions into practice with functional mobility Other Brace: Success with prefab AFO trial Restrictions Weight Bearing Restrictions: No   Has the patient had 2 or more falls or a fall with injury in the past year? Yes, patient reports 1 to 2 falls weekly over the past year when her legs give out.   Prior Activity Level Community (5-7x/wk): decline in function over past year. Uses RW, does not drive, frequent falls. I adls  Prior Functional Level Self Care: Did the patient need help bathing, dressing, using the  toilet or eating? Independent  Indoor Mobility: Did the patient need assistance with walking from room to room (with or without device)? Independent  Stairs: Did the patient need assistance with internal or external stairs (with or without device)? Needed some help  Functional Cognition: Did the patient need help planning regular tasks such as shopping or remembering to take medications? [place] / Equipment Home Assistive Devices/Equipment: Environmental consultant (specify type), Cane (specify quad or straight) Home Equipment: Shower seat, Hand held shower head, Cane - quad, Walker - 4 wheels  Prior Device Use: Indicate devices/aids used by the patient prior to current illness, exacerbation or injury? Walker and cane  Current Functional Level Cognition  Overall Cognitive Status: Within Functional Limits for tasks assessed Orientation Level: Oriented X4 General Comments: feeling slightly frustrated about the fact that she hasn't walked yet    Extremity Assessment (includes Sensation/Coordination)  Upper Extremity Assessment: Defer to OT evaluation  Lower Extremity Assessment: Generalized weakness, RLE  deficits/detail RLE Deficits / Details: Pt reports difficulty moving RLE and a difference in sensation. Reports feeling pressure, but it does not feel normal in comparison to LLE. Ankle dorsiflexion2/5, knee extension 2+/5 RLE Sensation: decreased light touch RLE Coordination: decreased gross motor    ADLs  Overall ADL's : Needs assistance/impaired Eating/Feeding: Independent, Sitting Grooming: Wash/dry face, Sitting, Set up Upper Body Bathing: Supervision/ safety, Sitting Lower Body Bathing: Sit to/from stand, Maximal assistance, +2 for physical assistance, +2 for safety/equipment Upper Body Dressing : Supervision/safety, Sitting Lower Body Dressing: Total assistance, Sitting/lateral leans Lower Body Dressing Details (indicate cue type and reason): total A d/t back precautions Toilet Transfer: Moderate assistance, BSC, RW, Stand-pivot Toilet Transfer Details (indicate cue type and reason): MOD A to advance RLE during stand pivot transfer; pt with flexed posture throuhgout transfer; verbal/ tactile cues to self- correct Toileting- Clothing Manipulation and Hygiene: Set up, Sitting/lateral lean Toileting - Clothing Manipulation Details (indicate cue type and reason): able to complete sitting anterior pericare using lateral leans from recliner Functional mobility during ADLs: Minimal assistance, +2 for physical assistance, +2 for safety/equipment, Rolling walker General ADL Comments: supervision UB ADL; MAX-Total LB ADL    Mobility  Overal bed mobility: Needs Assistance Bed Mobility: Rolling, Sidelying to Sit Rolling: Min assist Sidelying to sit: Mod assist General bed mobility comments: cues to initiate log roll; MOD A to elevate trunk and scoot hips forward    Transfers  Overall transfer level: Needs assistance Equipment used: Rolling walker (2 wheeled) Transfer via Lift Equipment: Stedy Transfers: Sit to/from Stand Sit to Stand: Mod assist, Min assist Stand pivot transfers: Mod  assist General transfer comment: Mod assist to stand from bed; min assist to stand from recliner -- armrests make the difference, they give a stable platform for her to push up from    Ambulation / Gait / Stairs / Wheelchair Mobility  Ambulation/Gait Ambulation/Gait assistance: Min assist, Mod assist Gait Distance (Feet): 25 Feet(10 feet then 15 feet) Assistive device: Rolling walker (2 wheeled) Gait Pattern/deviations: Ataxic General Gait Details: Much improved RLE advancement with AFO for dorsiflexion assist and better knee stability; did not need physical assist to advance RLE today; Had to assist and cue pt for sequencing steps and RW.  Pt kept stepping too far into RW and getting off balance.  Pt needed assist to steer RW as well.  Gait velocity: slowed Gait velocity interpretation: <1.31 ft/sec, indicative of household ambulator    Posture / Balance Dynamic Sitting Balance Sitting balance - Comments:  able to static sit EOB Balance Overall balance assessment: Needs assistance Sitting-balance support: Feet supported, No upper extremity supported Sitting balance-Leahy Scale: Fair Sitting balance - Comments: able to static sit EOB Standing balance support: Bilateral upper extremity supported Standing balance-Leahy Scale: Poor Standing balance comment: reliant on BUE support in standing as well as support on RW    Special needs/care consideration BiPAP/CPAP  N/a CPM  N/a Continuous Drip IV  N/a Dialysis n/a Life Vest  N/a Oxygen  N/a Special Bed  N/a Trach Size  N/a Wound Vac n/a Skin surgical incision Bowel mgmt:  Constipated; continent LBM 10/8* Bladder mgmt: external catheter Diabetic mgmt: n/a Behavioral consideration  N/a Chemo/radiation  N/a Designated visitor spouse, Elenore Rota   Previous Home Environment  Living Arrangements: Spouse/significant other  Lives With: Spouse Available Help at Discharge: Family, Available 24 hours/day Type of Home: House Home Layout:  Multi-level, Able to live on main level with bedroom/bathroom, Full bath on main level(3 levels) Alternate Level Stairs-Number of Steps: Flight with chair lift Home Access: Other (comment)(chair lift from garage into main level of home) Bathroom Shower/Tub: Walk-in shower, Tub only Biochemist, clinical: Standard Bathroom Accessibility: Yes How Accessible: Accessible via walker [place]: No Additional Comments: initial CSF leak October 2019  Discharge Living Setting Plans for Discharge Living Setting: Patient's home, Lives with (comment)(spouse) Type of Home at Discharge: House Discharge Home Layout: Multi-level, Full bath on main level, Able to live on main level with bedroom/bathroom Alternate Level Stairs-Rails: (satys on main level only) Discharge Home Access: Other (comment)(chair leift on stairs from garage to main level entry) Discharge Bathroom Shower/Tub: Walk-in shower Discharge Bathroom Toilet: Standard Discharge Bathroom Accessibility: Yes Does the patient have any problems obtaining your medications?: No  Social/Family/Support Systems Patient Roles: Spouse, Parent(4 children , non local to [place], [place]) Contact Information: spouse, Elenore Rota Anticipated Caregiver: 78 year old spouse Anticipated Caregiver's Contact Information: see above Ability/Limitations of Caregiver: no limitations Caregiver Availability: 24/7 Discharge Plan Discussed with Primary Caregiver: Yes Is Caregiver In Agreement with Plan?: Yes Does Caregiver/Family have Issues with Lodging/Transportation while Pt is in Rehab?: No   Spouse staying at local hotel when in town visiting and then [place]. They have a local son in [place] which I have encouraged him to stay there locally. Spouse is 67 years old and uses a cane.  Goals/Additional Needs Patient/Family Goal for Rehab: supervision PT, supervision to min OT Expected length of stay: ELOS 10 to 14 days Pt/Family Agrees  to Admission and willing to participate: Yes Program Orientation Provided & Reviewed with Pt/Caregiver Including Roles  & Responsibilities: Yes  Decrease burden of Care through IP rehab admission: n/a  Possible need for SNF placement upon discharge: not expected  Patient Condition: I have reviewed medical records from [facility], spoken with patient. I met with patient at the bedside for inpatient rehabilitation assessment.  Patient will benefit from ongoing PT and OT, can actively participate in 3 hours of therapy a day 5 days of the week, and can make measurable gains during the admission.  Patient will also benefit from the coordinated team approach during an Inpatient Acute Rehabilitation admission.  The patient will receive intensive therapy as well as Rehabilitation physician, nursing, social worker, and care management interventions.  Due to bladder management, bowel management, safety, skin/wound care, disease management, medication administration, pain management and patient education the patient requires 24 hour a day rehabilitation nursing.  The patient is currently mod assist with  mobility and basic ADLs.  Discharge setting and therapy post discharge at home with home health is anticipated.  Patient has agreed to participate in the Acute Inpatient Rehabilitation Program and will admit today.  Preadmission Screen Completed By:  Cleatrice Burke, 10/15/2018 1:49 PM ______________________________________________________________________   Discussed status with Dr. Posey Pronto  on 10/15/2018 at 1433 and received approval for admission today.  Admission Coordinator:  Cleatrice Burke, RN, time  8978 Date 10/15/2018   Assessment/Plan: Diagnosis: Lumbar reexploration after repair of CSF leak 1. Does the need for close, 24 hr/day Medical supervision in concert with the patient's rehab needs make it unreasonable for this patient to be served in a less intensive setting?  Yes 2. Co-Morbidities requiring supervision/potential complications: arthritis, back surgery, skiing accident 15 years ago with multiple broken bones, pleurisy and transverse myelitis 3. Due to bladder management, bowel management, safety, skin/wound care, disease management, pain management and patient education, does the patient require 24 hr/day rehab nursing? Yes 4. Does the patient require coordinated care of a physician, rehab nurse, PT, OT to address physical and functional deficits in the context of the above medical diagnosis(es)? Yes Addressing deficits in the following areas: balance, endurance, locomotion, strength, transferring, bathing, dressing, toileting and psychosocial support 5. Can the patient actively participate in an intensive therapy program of at least 3 hrs of therapy 5 days a week? Yes 6. The potential for patient to make measurable gains while on inpatient rehab is excellent 7. Anticipated functional outcomes upon discharge from inpatient rehab: supervision and min assist PT, supervision and min assist OT, n/a SLP 8. Estimated rehab length of stay to reach the above functional goals is: 12-16 days. 9. Anticipated discharge destination: Home 10. Overall Rehab/Functional Prognosis: good   MD Signature: Delice Lesch, MD, ABPMR

## 2018-10-14 NOTE — Plan of Care (Signed)

## 2018-10-14 NOTE — Consult Note (Signed)
Inpatient Rehabilitation Admissions Coordinator  Inpatient rehab consult received. I met with patient at bedside for rehab assessment. We discussed goals and expectations of an inpt rehab admit. She prefers inpt acute rehab rather than SNF. She lives in [place], [place] and Spouse staying at hotel locally when he visits. I will begin insurance authorization with [facility] Medicare for a possible admit to [facility] pending insurance approval.  Danne Baxter, RN, MSN Rehab Admissions Coordinator [phone] 10/14/2018 12:39 PM

## 2018-10-15 ENCOUNTER — Inpatient Hospital Stay (HOSPITAL_COMMUNITY): Payer: Medicare PPO

## 2018-10-15 ENCOUNTER — Encounter (HOSPITAL_COMMUNITY): Payer: Self-pay | Admitting: Nurse Practitioner

## 2018-10-15 ENCOUNTER — Inpatient Hospital Stay (HOSPITAL_COMMUNITY)
Admission: RE | Admit: 2018-10-15 | Discharge: 2018-10-28 | DRG: 560 | Disposition: A | Discharge status: Home Health | Payer: Medicare PPO | Source: Intra-hospital | Attending: Physical Medicine and Rehabilitation | Admitting: Physical Medicine and Rehabilitation

## 2018-10-15 ENCOUNTER — Encounter (HOSPITAL_COMMUNITY): Payer: Self-pay | Admitting: Physical Medicine and Rehabilitation

## 2018-10-15 ENCOUNTER — Other Ambulatory Visit: Payer: Self-pay

## 2018-10-15 DIAGNOSIS — Z96651 Presence of right artificial knee joint: Secondary | ICD-10-CM | POA: Diagnosis present

## 2018-10-15 DIAGNOSIS — N179 Acute kidney failure, unspecified: Secondary | ICD-10-CM | POA: Diagnosis present

## 2018-10-15 DIAGNOSIS — K219 Gastro-esophageal reflux disease without esophagitis: Secondary | ICD-10-CM | POA: Diagnosis present

## 2018-10-15 DIAGNOSIS — Z789 Other specified health status: Secondary | ICD-10-CM | POA: Diagnosis present

## 2018-10-15 DIAGNOSIS — I959 Hypotension, unspecified: Secondary | ICD-10-CM | POA: Diagnosis not present

## 2018-10-15 DIAGNOSIS — R270 Ataxia, unspecified: Secondary | ICD-10-CM

## 2018-10-15 DIAGNOSIS — G8918 Other acute postprocedural pain: Secondary | ICD-10-CM

## 2018-10-15 DIAGNOSIS — E871 Hypo-osmolality and hyponatremia: Secondary | ICD-10-CM | POA: Diagnosis present

## 2018-10-15 DIAGNOSIS — F329 Major depressive disorder, single episode, unspecified: Secondary | ICD-10-CM | POA: Diagnosis present

## 2018-10-15 DIAGNOSIS — Z888 Allergy status to other drugs, medicaments and biological substances status: Secondary | ICD-10-CM

## 2018-10-15 DIAGNOSIS — I10 Essential (primary) hypertension: Secondary | ICD-10-CM | POA: Diagnosis present

## 2018-10-15 DIAGNOSIS — G479 Sleep disorder, unspecified: Secondary | ICD-10-CM

## 2018-10-15 DIAGNOSIS — E869 Volume depletion, unspecified: Secondary | ICD-10-CM | POA: Diagnosis not present

## 2018-10-15 DIAGNOSIS — E876 Hypokalemia: Secondary | ICD-10-CM

## 2018-10-15 DIAGNOSIS — G970 Cerebrospinal fluid leak from spinal puncture: Secondary | ICD-10-CM | POA: Diagnosis present

## 2018-10-15 DIAGNOSIS — R2689 Other abnormalities of gait and mobility: Secondary | ICD-10-CM

## 2018-10-15 DIAGNOSIS — Z825 Family history of asthma and other chronic lower respiratory diseases: Secondary | ICD-10-CM | POA: Diagnosis not present

## 2018-10-15 DIAGNOSIS — K567 Ileus, unspecified: Secondary | ICD-10-CM | POA: Diagnosis present

## 2018-10-15 DIAGNOSIS — D500 Iron deficiency anemia secondary to blood loss (chronic): Secondary | ICD-10-CM | POA: Diagnosis present

## 2018-10-15 DIAGNOSIS — G959 Disease of spinal cord, unspecified: Secondary | ICD-10-CM | POA: Diagnosis present

## 2018-10-15 DIAGNOSIS — Z833 Family history of diabetes mellitus: Secondary | ICD-10-CM

## 2018-10-15 DIAGNOSIS — K5903 Drug induced constipation: Secondary | ICD-10-CM | POA: Diagnosis present

## 2018-10-15 DIAGNOSIS — Z8249 Family history of ischemic heart disease and other diseases of the circulatory system: Secondary | ICD-10-CM

## 2018-10-15 DIAGNOSIS — M7989 Other specified soft tissue disorders: Secondary | ICD-10-CM | POA: Diagnosis not present

## 2018-10-15 DIAGNOSIS — Z419 Encounter for procedure for purposes other than remedying health state, unspecified: Secondary | ICD-10-CM

## 2018-10-15 DIAGNOSIS — K9189 Other postprocedural complications and disorders of digestive system: Secondary | ICD-10-CM | POA: Diagnosis not present

## 2018-10-15 DIAGNOSIS — K5900 Constipation, unspecified: Secondary | ICD-10-CM

## 2018-10-15 DIAGNOSIS — Z7409 Other reduced mobility: Secondary | ICD-10-CM | POA: Diagnosis present

## 2018-10-15 DIAGNOSIS — Z4789 Encounter for other orthopedic aftercare: Secondary | ICD-10-CM | POA: Diagnosis present

## 2018-10-15 MED ORDER — PROCHLORPERAZINE EDISYLATE 10 MG/2ML IJ SOLN
5.0000 mg | Freq: Four times a day (QID) | INTRAMUSCULAR | Status: DC | PRN
  Administered 2018-10-16: 5 mg via INTRAMUSCULAR
  Filled 2018-10-15: qty 2

## 2018-10-15 MED ORDER — ENSURE ENLIVE PO LIQD
237.0000 mL | Freq: Two times a day (BID) | ORAL | Status: DC
  Administered 2018-10-16 – 2018-10-27 (×19): 237 mL via ORAL

## 2018-10-15 MED ORDER — METOCLOPRAMIDE HCL 5 MG/ML IJ SOLN
10.0000 mg | Freq: Four times a day (QID) | INTRAMUSCULAR | Status: DC
  Administered 2018-10-16 – 2018-10-17 (×8): 10 mg via INTRAVENOUS
  Filled 2018-10-15 (×9): qty 2

## 2018-10-15 MED ORDER — AMLODIPINE BESYLATE 10 MG PO TABS
10.0000 mg | ORAL_TABLET | Freq: Every day | ORAL | Status: DC
  Administered 2018-10-16 – 2018-10-23 (×7): 10 mg via ORAL
  Filled 2018-10-15 (×8): qty 1

## 2018-10-15 MED ORDER — POLYETHYLENE GLYCOL 3350 17 G PO PACK
17.0000 g | PACK | Freq: Every day | ORAL | Status: DC
  Administered 2018-10-15 – 2018-10-16 (×2): 17 g via ORAL
  Filled 2018-10-15 (×2): qty 1

## 2018-10-15 MED ORDER — HYDROCHLOROTHIAZIDE 12.5 MG PO CAPS
12.5000 mg | ORAL_CAPSULE | Freq: Every day | ORAL | Status: DC
  Administered 2018-10-16 – 2018-10-23 (×8): 12.5 mg via ORAL
  Filled 2018-10-15 (×8): qty 1

## 2018-10-15 MED ORDER — IRBESARTAN 75 MG PO TABS
75.0000 mg | ORAL_TABLET | Freq: Every day | ORAL | Status: DC
  Administered 2018-10-16 – 2018-10-23 (×8): 75 mg via ORAL
  Filled 2018-10-15 (×8): qty 1

## 2018-10-15 MED ORDER — OXYCODONE HCL 5 MG PO TABS
5.0000 mg | ORAL_TABLET | ORAL | Status: DC | PRN
  Administered 2018-10-16: 22:00:00 5 mg via ORAL
  Filled 2018-10-15 (×3): qty 1

## 2018-10-15 MED ORDER — PROCHLORPERAZINE 25 MG RE SUPP: 12.5000 mg | Freq: Four times a day (QID) | RECTAL | Status: DC | PRN

## 2018-10-15 MED ORDER — DIPHENHYDRAMINE HCL 12.5 MG/5ML PO ELIX
12.5000 mg | ORAL_SOLUTION | Freq: Four times a day (QID) | ORAL | Status: DC | PRN
  Administered 2018-10-16 – 2018-10-18 (×2): 25 mg via ORAL
  Filled 2018-10-15 (×2): qty 10

## 2018-10-15 MED ORDER — PROCHLORPERAZINE MALEATE 5 MG PO TABS
5.0000 mg | ORAL_TABLET | Freq: Four times a day (QID) | ORAL | Status: DC | PRN
  Administered 2018-10-17: 10 mg via ORAL
  Filled 2018-10-15: qty 2

## 2018-10-15 MED ORDER — MENTHOL 3 MG MT LOZG
1.0000 | LOZENGE | OROMUCOSAL | Status: DC | PRN
  Administered 2018-10-16: 3 mg via ORAL
  Filled 2018-10-15: qty 9

## 2018-10-15 MED ORDER — CYCLOBENZAPRINE HCL 10 MG PO TABS
10.0000 mg | ORAL_TABLET | Freq: Three times a day (TID) | ORAL | Status: DC | PRN
  Administered 2018-10-22 (×2): 10 mg via ORAL
  Filled 2018-10-15 (×2): qty 1

## 2018-10-15 MED ORDER — TRAZODONE HCL 50 MG PO TABS
25.0000 mg | ORAL_TABLET | Freq: Every evening | ORAL | Status: DC | PRN
  Administered 2018-10-17: 50 mg via ORAL
  Filled 2018-10-15: qty 1

## 2018-10-15 MED ORDER — OXYCODONE HCL 5 MG PO TABS
10.0000 mg | ORAL_TABLET | ORAL | Status: DC | PRN
  Administered 2018-10-16: 10 mg via ORAL
  Filled 2018-10-15: qty 2

## 2018-10-15 MED ORDER — FLEET ENEMA 7-19 GM/118ML RE ENEM
1.0000 | ENEMA | Freq: Once | RECTAL | Status: AC
  Administered 2018-10-16: 05:00:00 1 via RECTAL
  Filled 2018-10-15: qty 1

## 2018-10-15 MED ORDER — FLEET ENEMA 7-19 GM/118ML RE ENEM: 1.0000 | ENEMA | Freq: Once | RECTAL | Status: DC | PRN

## 2018-10-15 MED ORDER — FLEET ENEMA 7-19 GM/118ML RE ENEM
1.0000 | ENEMA | Freq: Every day | RECTAL | Status: DC | PRN
  Filled 2018-10-15: qty 1

## 2018-10-15 MED ORDER — DESMOPRESSIN ACETATE 0.1 MG PO TABS
0.1000 mg | ORAL_TABLET | Freq: Every day | ORAL | Status: DC
  Administered 2018-10-15 – 2018-10-27 (×13): 0.1 mg via ORAL
  Filled 2018-10-15 (×13): qty 1

## 2018-10-15 MED ORDER — BUTALBITAL-APAP-CAFFEINE 50-325-40 MG PO TABS
1.0000 | ORAL_TABLET | Freq: Two times a day (BID) | ORAL | Status: DC
  Administered 2018-10-15 – 2018-10-28 (×26): 1 via ORAL
  Filled 2018-10-15 (×26): qty 1

## 2018-10-15 MED ORDER — VENLAFAXINE HCL ER 75 MG PO CP24
75.0000 mg | ORAL_CAPSULE | Freq: Every day | ORAL | Status: DC
  Administered 2018-10-16 – 2018-10-28 (×13): 75 mg via ORAL
  Filled 2018-10-15 (×13): qty 1

## 2018-10-15 MED ORDER — ACETAMINOPHEN 325 MG PO TABS
325.0000 mg | ORAL_TABLET | ORAL | Status: DC | PRN
  Administered 2018-10-16 – 2018-10-27 (×4): 650 mg via ORAL
  Filled 2018-10-15 (×4): qty 2

## 2018-10-15 MED ORDER — GUAIFENESIN-DM 100-10 MG/5ML PO SYRP
5.0000 mL | ORAL_SOLUTION | Freq: Four times a day (QID) | ORAL | Status: DC | PRN
  Administered 2018-10-27: 10 mL via ORAL
  Filled 2018-10-15: qty 10

## 2018-10-15 MED ORDER — PANTOPRAZOLE SODIUM 40 MG IV SOLR
40.0000 mg | Freq: Two times a day (BID) | INTRAVENOUS | Status: DC
  Administered 2018-10-15: 22:00:00 40 mg via INTRAVENOUS
  Filled 2018-10-15 (×2): qty 40

## 2018-10-15 MED ORDER — BISACODYL 10 MG RE SUPP
10.0000 mg | Freq: Every day | RECTAL | Status: DC | PRN
  Administered 2018-10-16: 10 mg via RECTAL
  Filled 2018-10-15: qty 1

## 2018-10-15 MED ORDER — LORATADINE 10 MG PO TABS
10.0000 mg | ORAL_TABLET | Freq: Every day | ORAL | Status: DC
  Administered 2018-10-15 – 2018-10-28 (×14): 10 mg via ORAL
  Filled 2018-10-15 (×14): qty 1

## 2018-10-15 MED ORDER — CAMPHOR-MENTHOL 0.5-0.5 % EX LOTN
TOPICAL_LOTION | Freq: Three times a day (TID) | CUTANEOUS | Status: DC
  Administered 2018-10-16 – 2018-10-17 (×3): via TOPICAL
  Administered 2018-10-17: 1 via TOPICAL
  Administered 2018-10-17 – 2018-10-28 (×26): via TOPICAL
  Filled 2018-10-15: qty 222

## 2018-10-15 MED ORDER — PHENOL 1.4 % MT LIQD: 1.0000 | OROMUCOSAL | Status: DC | PRN

## 2018-10-15 MED ORDER — ALUM & MAG HYDROXIDE-SIMETH 200-200-20 MG/5ML PO SUSP
30.0000 mL | ORAL | Status: DC | PRN
  Administered 2018-10-16 – 2018-10-20 (×2): 30 mL via ORAL
  Filled 2018-10-15 (×3): qty 30

## 2018-10-15 NOTE — Discharge Summary (Signed)
  Physician Discharge Summary  Patient ID: Alicia Valdez MRN: [id number] DOB/AGE: 1935/01/03 83 y.o.  Admit date: 10/09/2018 Discharge date: 10/15/2018  Admission Diagnoses:csf leak L4/5  Discharge Diagnoses:  Active Problems:   CSF leak at LP site   Surgery, elective   AKI (acute kidney injury) ([place])   Hypokalemia   Post-operative pain   Discharged Condition: good  Hospital Course: Alicia Valdez was admitted and taken to the operating room for an exploration of the anterior spinal canal at the L4/5 disc space. I placed a piece of gelfoam in the space between the thecal sac and the vertebra. She has reported weakness in the right lower extremity. She is up with pt and will go to inpatient rehabilitation  Treatments: surgery: as above  Discharge Exam: Blood pressure (!) 110/59, pulse 75, temperature (!) 97.5 F (36.4 C), temperature source Oral, resp. rate 18, height 5\' 2"  (1.575 m), weight 64 kg, SpO2 100 %. General appearance: alert, cooperative, appears stated age and mild distress  Disposition: Discharge disposition: [place] Not Defined      Cerebrospinal fluid leak from spinal puncture  Allergies as of 10/15/2018      Reactions   Statins Other (See Comments)   Muscle weakness   Zoledronic Acid Other (See Comments)   Pt could not walk and was hospitalized   Niacin And Related Itching      Medication List    TAKE these medications   amLODipine 10 MG tablet Commonly known as: NORVASC Take 10 mg by mouth daily.   Butalbital-APAP-Caffeine 50-325-40 MG capsule Take 1 capsule by mouth 2 (two) times daily.   cloNIDine 0.2 MG tablet Commonly known as: CATAPRES Take 0.2 mg by mouth every 6 (six) hours as needed.   desmopressin 0.1 MG tablet Commonly known as: DDAVP Take 0.1 mg by mouth at bedtime.   desvenlafaxine 100 MG 24 hr tablet Commonly known as: PRISTIQ Take 100 mg by mouth daily.   esomeprazole 20 MG capsule Commonly known  as: NEXIUM Take 20 mg by mouth daily at 12 noon.   valsartan-hydrochlorothiazide 80-12.5 MG tablet Commonly known as: DIOVAN-HCT Take 1 tablet by mouth daily.      Follow-up Information    [facility] Follow up.   Specialty: Rehabilitation Why: rehab Contact information: [address] [phone] [place]          Signed: Winfield Cunas 10/15/2018, 2:25 PM

## 2018-10-15 NOTE — H&P (Signed)
Physical Medicine and Rehabilitation Admission H&P    CC: Functional deficits post CSF leak repair    HPI: Alicia Valdez is an 83 year old female, transverse myelitis with mild residual RLE weakness, prior back surgery, chronic back pain s/p ESI with CSF leak treated with blood patch without termination of leak as well as persistent HA. History taken from chart review and patient. She was admitted on 10/09/2018 for right lumbar spine reexploration for repair of leak by Dr. Christella Noa on 10/09/18. Post op continues to have RLE>LLE weakness and AFO ordered for right foot. Therapy ongoing and patient limited by ataxia with balance deficit and difficulty with ADLs.  Hospital course further complicated by hypokalemia and?  CKD versus AKI.  CIR recommended due to functional deficits.     Review of Systems  Constitutional: Positive for malaise/fatigue.  HENT: Negative for hearing loss and tinnitus.   Eyes: Negative for blurred vision and double vision.  Respiratory: Negative for cough and shortness of breath.   Cardiovascular: Negative for chest pain and palpitations.  Gastrointestinal: Positive for constipation, heartburn (burning sensation in throat) and nausea.  Genitourinary: Positive for frequency. Negative for dysuria.  Musculoskeletal: Positive for falls (crumples to the floor), joint pain and myalgias.  Skin: Negative for itching and rash.  Neurological: Positive for sensory change (numbness RLE>LLE ), focal weakness and weakness (RLE weakness for more than a year). Negative for speech change and headaches (used to have debilitating HA--none since surgery ).  All other systems reviewed and are negative.    Past Medical History:  Diagnosis Date  . Anemia   . GERD (gastroesophageal reflux disease)   . Hypertension   . Transverse myelitis ([place]) 2016    Past Surgical History:  Procedure Laterality Date  . BACK SURGERY    . EPIDURAL BLOOD PATCH    . EYE SURGERY Bilateral    cataract   . FIXATION KYPHOPLASTY    . JOINT REPLACEMENT Right    Knee  . LUMBAR LAMINECTOMY/DECOMPRESSION MICRODISCECTOMY N/A 10/09/2018   Procedure: right lumbar four-five reexploration for cerebrospinal fluid leak;  Surgeon: Ashok Pall, MD;  Location: [facility];  Service: Neurosurgery;  Laterality: N/A;  . PARAESOPHAGEAL HERNIA REPAIR    . UPPER GI ENDOSCOPY      Family History  Problem Relation Age of Onset  . Healthy Mother        died at 66  . Diabetes Mellitus II Father        died at 44 years  . Heart failure Father   . Emphysema Brother      Social History:   Married. 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 Originally from [place]--has been here since 64's. Lives in [place], [place]  and here for surgery. She reports that she has never smoked. She has never used smokeless tobacco. She reports that she does not use drugs. She drinks a glass of wine occasionally.     Allergies  Allergen Reactions  . Statins Other (See Comments)    Muscle weakness  . Zoledronic Acid Other (See Comments)    Pt could not walk and was hospitalized  . Niacin And Related Itching    Medications Prior to Admission  Medication Sig Dispense Refill  . amLODipine (NORVASC) 10 MG tablet Take 10 mg by mouth daily.    . Butalbital-APAP-Caffeine 50-325-40 MG capsule Take 1 capsule by mouth 2 (two) times daily.    . cloNIDine (CATAPRES) 0.2 MG tablet Take 0.2 mg by mouth  every 6 (six) hours as needed.    . desmopressin (DDAVP) 0.1 MG tablet Take 0.1 mg by mouth at bedtime.     Marland Kitchen desvenlafaxine (PRISTIQ) 100 MG 24 hr tablet Take 100 mg by mouth daily.    Marland Kitchen esomeprazole (NEXIUM) 20 MG capsule Take 20 mg by mouth daily at 12 noon.     . valsartan-hydrochlorothiazide (DIOVAN-HCT) 80-12.5 MG tablet Take 1 tablet by mouth daily.      Drug Regimen Review  Drug regimen was reviewed and remains appropriate with no significant issues identified  Home: Home Living Family/patient expects to be discharged to:: Private  residence Living Arrangements: Spouse/significant other Available Help at Discharge: Family, Available 24 hours/day Type of Home: House Home Access: Other (comment)(chair lift from garage into main level of home) Home Layout: Multi-level, Able to live on main level with bedroom/bathroom, Full bath on main level(3 levels) Alternate Level Stairs-Number of Steps: Flight with chair lift Bathroom Shower/Tub: Walk-in shower, Tub only Firefighter: Standard Bathroom Accessibility: Yes Home Equipment: Information systems manager, Hand held shower head, Cane - quad, Walker - 4 wheels Additional Comments: initial CSF leak October 2019  Lives With: Spouse   Functional History: Prior Function Level of Independence: Independent with assistive device(s) Comments: Enjoys playing the piano. Started using cane and walker since back pain  Functional Status:  Mobility: Bed Mobility Overal bed mobility: Needs Assistance Bed Mobility: Rolling, Sidelying to Sit Rolling: Min assist Sidelying to sit: Mod assist General bed mobility comments: cues to initiate log roll; MOD A to elevate trunk and scoot hips forward Transfers Overall transfer level: Needs assistance Equipment used: Rolling walker (2 wheeled) Transfer via Lift Equipment: Stedy Transfers: Sit to/from Stand Sit to Stand: Mod assist, Min assist Stand pivot transfers: Mod assist General transfer comment: Mod assist to stand from bed; min assist to stand from recliner -- armrests make the difference, they give a stable platform for her to push up from Ambulation/Gait Ambulation/Gait assistance: Min assist, Mod assist Gait Distance (Feet): 25 Feet(10 feet then 15 feet) Assistive device: Rolling walker (2 wheeled) Gait Pattern/deviations: Ataxic General Gait Details: Much improved RLE advancement with AFO for dorsiflexion assist and better knee stability; did not need physical assist to advance RLE today; Had to assist and cue pt for sequencing steps and  RW.  Pt kept stepping too far into RW and getting off balance.  Pt needed assist to steer RW as well.  Gait velocity: slowed Gait velocity interpretation: <1.31 ft/sec, indicative of household ambulator    ADL: ADL Overall ADL's : Needs assistance/impaired Eating/Feeding: Independent, Sitting Grooming: Wash/dry face, Sitting, Set up Upper Body Bathing: Supervision/ safety, Sitting Lower Body Bathing: Sit to/from stand, Maximal assistance, +2 for physical assistance, +2 for safety/equipment Upper Body Dressing : Supervision/safety, Sitting Lower Body Dressing: Total assistance, Sitting/lateral leans Lower Body Dressing Details (indicate cue type and reason): total A d/t back precautions Toilet Transfer: Moderate assistance, BSC, RW, Stand-pivot Toilet Transfer Details (indicate cue type and reason): MOD A to advance RLE during stand pivot transfer; pt with flexed posture throuhgout transfer; verbal/ tactile cues to self- correct Toileting- Clothing Manipulation and Hygiene: Set up, Sitting/lateral lean Toileting - Clothing Manipulation Details (indicate cue type and reason): able to complete sitting anterior pericare using lateral leans from recliner Functional mobility during ADLs: Minimal assistance, +2 for physical assistance, +2 for safety/equipment, Rolling walker General ADL Comments: supervision UB ADL; MAX-Total LB ADL  Cognition: Cognition Overall Cognitive Status: Within Functional Limits for tasks assessed Orientation  Level: Oriented X4 Cognition Arousal/Alertness: Awake/alert Behavior During Therapy: WFL for tasks assessed/performed Overall Cognitive Status: Within Functional Limits for tasks assessed General Comments: feeling slightly frustrated about the fact that she hasn't walked yet   Blood pressure (!) 110/59, pulse 75, temperature (!) 97.5 F (36.4 C), temperature source Oral, resp. rate 18, height 5\' 2"  (1.575 m), weight 64 kg, SpO2 100 %. Physical Exam  Vitals  reviewed. Constitutional: She is oriented to person, place, and time. She appears well-developed and well-nourished.  HENT:  Head: Normocephalic and atraumatic.  Eyes: Pupils are equal, round, and reactive to light. Conjunctivae and EOM are normal. Right eye exhibits no discharge.  Neck: Normal range of motion. Neck supple. No tracheal deviation present. No thyromegaly present.  Cardiovascular: Normal rate and regular rhythm.  Respiratory: Effort normal and breath sounds normal. No stridor. No respiratory distress.  GI: Soft. She exhibits no distension. Bowel sounds are decreased. There is no abdominal tenderness.  Musculoskeletal:        General: No edema.     Comments: LE edema.  Right knee with arthritic changes and well healed incisions.   Neurological: She is alert and oriented to person, place, and time. No cranial nerve deficit.  Motor: Bilateral upper extremities: 5/5 proximal to distal Right lower extremity for hip flexion, knee extension 2-/5, ankle dorsiflexion 4-/5 Left lower extremity: Hip flexion, knee extension 3 -/5, ankle dorsiflexion 4/5  Skin: Skin is warm and dry.  Psychiatric: She has a normal mood and affect. Her behavior is normal.    No results found for this or any previous visit (from the past 48 hour(s)). No results found.     Medical Problem List and Plan: 1.  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  Admit to CIR 2.  Antithrombotics: -DVT/anticoagulation:  Mechanical: Sequential compression devices, below knee Bilateral lower extremities   CBC with differential tomorrow a.m.  -antiplatelet therapy: N/A 3. Pain Management: Will d/c Celebrex as likely leading to regurgitation/acid reflux  Monitor with increased activity and upright posture. 4. Mood: LCSW to follow for evaluation and support.   -antipsychotic agents: NA 5. Neuropsych: This patient is  capable of making decisions on her own behalf. 6. Skin/Wound Care:  Routine pressure relief measures.  7. Fluids/Electrolytes/Nutrition: Monitor I/O. CMP ordered for tomorrow AM. 8.  Hypokalemia: Needs post op labs-- BMP ordered for tomorrow a.m. 9.  AKI versus CKD:  Needs post op labs. BMP ordered for tomorrow a.m. 10. Severe GERD: Will schedule Protonix as IV BID. 11. HTN: Monitor BP tid--stable on amlodipine and Avapro/HCTZ.    Monitor with increased mobility.  12. Reactive depression: Feels fine at the moment but has had a hard time past 6 months. Will resume at lower dose and monitor.  Team to provide support.  13. Constipation: Question ileus--KUB ordered as patient has been unable to eat with severe GERD. Add miralax daily and enema today. Reglan if needed  Jacquelynn Creeamela S Love, PA-C 10/15/2018   I have personally performed a face to face diagnostic evaluation, including, but not limited to relevant history and physical exam findings, of this patient and developed relevant assessment and plan.  Additionally, I have reviewed and concur with the physician assistant's documentation above.  Maryla MorrowAnkit Tianni Escamilla, MD, ABPMR

## 2018-10-15 NOTE — Progress Notes (Signed)
Physical Therapy Treatment Patient Details Name: Alicia Valdez MRN: [id number] DOB: 22-Jan-1935 Today's Date: 10/15/2018    History of Present Illness Pt is 83 yo female s/p R L4-5 re-exploration for CSF leak from spinal puncture. PMH including epidural causing the leak in July, blood patches in August and October, R TKA, and hiatal hernia.     PT Comments    Pt admitted with above diagnosis. Pt was able to ambulate with RW with mod asssist of 2 with close chair follow.  Does have LOB needing assist.  PRogressing well and right AFO helps pt.  MD ordered consult for custom AFO.  Will follow acutely.   Pt currently with functional limitations due to balance and endurance deficits. Pt will benefit from skilled PT to increase their independence and safety with mobility to allow discharge to the venue listed below.     Follow Up Recommendations  CIR     Equipment Recommendations  Rolling walker with 5" wheels;3in1 (PT)(right AFO)    Recommendations for Other Services       Precautions / Restrictions Precautions Precautions: Back Precaution Booklet Issued: Yes (comment) Precaution Comments: reviewed all precautions; pt verbalized understanding, but noted some difficulty putting precautions into practice with functional mobility Required Braces or Orthoses: Other Brace Other Brace: Success with prefab AFO trial Restrictions Weight Bearing Restrictions: No    Mobility  Bed Mobility Overal bed mobility: Needs Assistance Bed Mobility: Rolling;Sidelying to Sit Rolling: Min assist Sidelying to sit: Mod assist       General bed mobility comments: cues to initiate log roll; MOD A to elevate trunk and scoot hips forward  Transfers Overall transfer level: Needs assistance Equipment used: Rolling walker (2 wheeled) Transfers: Sit to/from Stand Sit to Stand: Mod assist;Min assist         General transfer comment: Mod assist to stand from bed; min assist to stand from recliner --  armrests make the difference, they give a stable platform for her to push up from  Ambulation/Gait Ambulation/Gait assistance: Min assist;Mod assist Gait Distance (Feet): 25 Feet(10 feet then 15 feet) Assistive device: Rolling walker (2 wheeled) Gait Pattern/deviations: Ataxic Gait velocity: slowed Gait velocity interpretation: <1.31 ft/sec, indicative of household ambulator General Gait Details: Much improved RLE advancement with AFO for dorsiflexion assist and better knee stability; did not need physical assist to advance RLE today; Had to assist and cue pt for sequencing steps and RW.  Pt kept stepping too far into RW and getting off balance.  Pt needed assist to steer RW as well.    Stairs             Wheelchair Mobility    Modified Rankin (Stroke Patients Only)       Balance Overall balance assessment: Needs assistance Sitting-balance support: Feet supported;No upper extremity supported Sitting balance-Leahy Scale: Fair Sitting balance - Comments: able to static sit EOB   Standing balance support: Bilateral upper extremity supported Standing balance-Leahy Scale: Poor Standing balance comment: reliant on BUE support in standing as well as support on RW                            Cognition Arousal/Alertness: Awake/alert Behavior During Therapy: [facility] for tasks assessed/performed Overall Cognitive Status: Within Functional Limits for tasks assessed  Exercises General Exercises - Lower Extremity Quad Sets: AROM;Both;10 reps;Supine Gluteal Sets: AROM;Both;10 reps;Supine Long Arc Quad: AROM;Both;10 reps;Seated Hip Flexion/Marching: AROM;Both;10 reps;Seated    General Comments General comments (skin integrity, edema, etc.): Able to get order for right AFO from MD       Pertinent Vitals/Pain Pain Assessment: No/denies pain    Home Living                      Prior Function             PT Goals (current goals can now be found in the care plan section) Acute Rehab PT Goals Patient Stated Goal: Get back to normal Progress towards PT goals: Progressing toward goals    Frequency    Min 4X/week      PT Plan Current plan remains appropriate    Co-evaluation              AM-PAC PT "6 Clicks" Mobility   Outcome Measure  Help needed turning from your back to your side while in a flat bed without using bedrails?: A Little Help needed moving from lying on your back to sitting on the side of a flat bed without using bedrails?: A Lot Help needed moving to and from a bed to a chair (including a wheelchair)?: A Lot Help needed standing up from a chair using your arms (e.g., wheelchair or bedside chair)?: A Little Help needed to walk in hospital room?: A Lot Help needed climbing 3-5 steps with a railing? : A Lot 6 Click Score: 14    End of Session Equipment Utilized During Treatment: Gait belt;Other (comment)(R LE AFO) Activity Tolerance: Patient tolerated treatment well Patient left: with call bell/phone within reach;in chair;with chair alarm set;with family/visitor present Nurse Communication: Mobility status PT Visit Diagnosis: Unsteadiness on feet (R26.81);Other abnormalities of gait and mobility (R26.89);Muscle weakness (generalized) (M62.81)     Time: 4536-4680 PT Time Calculation (min) (ACUTE ONLY): 27 min  Charges:  $Gait Training: 8-22 mins $Therapeutic Exercise: 8-22 mins                     [place] Pager:  [phone]  Office:  [place] 10/15/2018, 1:06 PM

## 2018-10-15 NOTE — Progress Notes (Signed)
Orthopedic Tech Progress Note Patient Details:  Alicia Valdez Feb 06, 1935 [id number]  Patient ID: Alicia Valdez, female   DOB: 12-Aug-1935, 83 y.o.   MRN: [id number]   Alicia Valdez 10/15/2018, 11:48 AMCalled [employer] for right AFO brace.

## 2018-10-15 NOTE — H&P (Addendum)
Physical Medicine and Rehabilitation Admission H&P    CC: Functional deficits post CSF leak repair    HPI: Alicia Valdez is an 83 year old female, transverse myelitis with mild residual RLE weakness, prior back surgery, chronic back pain s/p ESI with CSF leak treated with blood patch without termination of leak as well as persistent HA. History taken from chart review and patient. She was admitted on 10/09/2018 for right lumbar spine reexploration for repair of leak by Dr. Christella Noa on 10/09/18. Post op continues to have RLE>LLE weakness and AFO ordered for right foot. Therapy ongoing and patient limited by ataxia with balance deficit and difficulty with ADLs.  Hospital course further complicated by hypokalemia and?  CKD versus AKI.  CIR recommended due to functional deficits. Please see preadmission assessment from earlier today as well.     Review of Systems  Constitutional: Positive for malaise/fatigue.  HENT: Negative for hearing loss and tinnitus.   Eyes: Negative for blurred vision and double vision.  Respiratory: Negative for cough and shortness of breath.   Cardiovascular: Negative for chest pain and palpitations.  Gastrointestinal: Positive for constipation, heartburn (burning sensation in throat) and nausea.  Genitourinary: Positive for frequency. Negative for dysuria.  Musculoskeletal: Positive for falls (crumples to the floor), joint pain and myalgias.  Skin: Negative for itching and rash.  Neurological: Positive for sensory change (numbness RLE>LLE ), focal weakness and weakness (RLE weakness for more than a year). Negative for speech change and headaches (used to have debilitating HA--none since surgery ).  All other systems reviewed and are negative.    Past Medical History:  Diagnosis Date  . Anemia   . GERD (gastroesophageal reflux disease)   . Hypertension   . Transverse myelitis ([place]) 2016    Past Surgical History:  Procedure Laterality Date  . BACK SURGERY    .  EPIDURAL BLOOD PATCH    . EYE SURGERY Bilateral    cataract  . FIXATION KYPHOPLASTY    . JOINT REPLACEMENT Right    Knee  . LUMBAR LAMINECTOMY/DECOMPRESSION MICRODISCECTOMY N/A 10/09/2018   Procedure: right lumbar four-five reexploration for cerebrospinal fluid leak;  Surgeon: Ashok Pall, MD;  Location: [facility];  Service: Neurosurgery;  Laterality: N/A;  . PARAESOPHAGEAL HERNIA REPAIR    . UPPER GI ENDOSCOPY      Family History  Problem Relation Age of Onset  . Healthy Mother        died at 74  . Diabetes Mellitus II Father        died at 57 years  . Heart failure Father   . Emphysema Brother      Social History:   Married. 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 Originally from [place]--has been here since 41's. Lives in [place], [place]  and here for surgery. She reports that she has never smoked. She has never used smokeless tobacco. She reports that she does not use drugs. She drinks a glass of wine occasionally.     Allergies  Allergen Reactions  . Statins Other (See Comments)    Muscle weakness  . Zoledronic Acid Other (See Comments)    Pt could not walk and was hospitalized  . Niacin And Related Itching    Medications Prior to Admission  Medication Sig Dispense Refill  . amLODipine (NORVASC) 10 MG tablet Take 10 mg by mouth daily.    . Butalbital-APAP-Caffeine 50-325-40 MG capsule Take 1 capsule by mouth 2 (two) times daily.    . cloNIDine (  CATAPRES) 0.2 MG tablet Take 0.2 mg by mouth every 6 (six) hours as needed.    . desmopressin (DDAVP) 0.1 MG tablet Take 0.1 mg by mouth at bedtime.     Marland Kitchen. desvenlafaxine (PRISTIQ) 100 MG 24 hr tablet Take 100 mg by mouth daily.    Marland Kitchen. esomeprazole (NEXIUM) 20 MG capsule Take 20 mg by mouth daily at 12 noon.     . valsartan-hydrochlorothiazide (DIOVAN-HCT) 80-12.5 MG tablet Take 1 tablet by mouth daily.      Drug Regimen Review  Drug regimen was reviewed and remains appropriate with no significant issues identified  Home: Home  Living Family/patient expects to be discharged to:: Private residence Living Arrangements: Spouse/significant other Available Help at Discharge: Family, Available 24 hours/day Type of Home: House Home Access: Other (comment)(chair lift from garage into main level of home) Home Layout: Multi-level, Able to live on main level with bedroom/bathroom, Full bath on main level(3 levels) Alternate Level Stairs-Number of Steps: Flight with chair lift Bathroom Shower/Tub: Walk-in shower, Tub only FirefighterBathroom Toilet: Standard Bathroom Accessibility: Yes Home Equipment: Information systems managerhower seat, Hand held shower head, Cane - quad, Walker - 4 wheels Additional Comments: initial CSF leak October 2019  Lives With: Spouse   Functional History: Prior Function Level of Independence: Independent with assistive device(s) Comments: Enjoys playing the piano. Started using cane and walker since back pain  Functional Status:  Mobility: Bed Mobility Overal bed mobility: Needs Assistance Bed Mobility: Rolling, Sidelying to Sit Rolling: Min assist Sidelying to sit: Mod assist General bed mobility comments: cues to initiate log roll; MOD A to elevate trunk and scoot hips forward Transfers Overall transfer level: Needs assistance Equipment used: Rolling walker (2 wheeled) Transfer via Lift Equipment: Stedy Transfers: Sit to/from Stand Sit to Stand: Mod assist, Min assist Stand pivot transfers: Mod assist General transfer comment: Mod assist to stand from bed; min assist to stand from recliner -- armrests make the difference, they give a stable platform for her to push up from Ambulation/Gait Ambulation/Gait assistance: Min assist, Mod assist Gait Distance (Feet): 25 Feet(10 feet then 15 feet) Assistive device: Rolling walker (2 wheeled) Gait Pattern/deviations: Ataxic General Gait Details: Much improved RLE advancement with AFO for dorsiflexion assist and better knee stability; did not need physical assist to advance  RLE today; Had to assist and cue pt for sequencing steps and RW.  Pt kept stepping too far into RW and getting off balance.  Pt needed assist to steer RW as well.  Gait velocity: slowed Gait velocity interpretation: <1.31 ft/sec, indicative of household ambulator    ADL: ADL Overall ADL's : Needs assistance/impaired Eating/Feeding: Independent, Sitting Grooming: Wash/dry face, Sitting, Set up Upper Body Bathing: Supervision/ safety, Sitting Lower Body Bathing: Sit to/from stand, Maximal assistance, +2 for physical assistance, +2 for safety/equipment Upper Body Dressing : Supervision/safety, Sitting Lower Body Dressing: Total assistance, Sitting/lateral leans Lower Body Dressing Details (indicate cue type and reason): total A d/t back precautions Toilet Transfer: Moderate assistance, BSC, RW, Stand-pivot Toilet Transfer Details (indicate cue type and reason): MOD A to advance RLE during stand pivot transfer; pt with flexed posture throuhgout transfer; verbal/ tactile cues to self- correct Toileting- Clothing Manipulation and Hygiene: Set up, Sitting/lateral lean Toileting - Clothing Manipulation Details (indicate cue type and reason): able to complete sitting anterior pericare using lateral leans from recliner Functional mobility during ADLs: Minimal assistance, +2 for physical assistance, +2 for safety/equipment, Rolling walker General ADL Comments: supervision UB ADL; MAX-Total LB ADL  Cognition: Cognition Overall  Cognitive Status: Within Functional Limits for tasks assessed Orientation Level: Oriented X4 Cognition Arousal/Alertness: Awake/alert Behavior During Therapy: WFL for tasks assessed/performed Overall Cognitive Status: Within Functional Limits for tasks assessed General Comments: feeling slightly frustrated about the fact that she hasn't walked yet   Blood pressure (!) 110/59, pulse 75, temperature (!) 97.5 F (36.4 C), temperature source Oral, resp. rate 18, height 5\' 2"   (1.575 m), weight 64 kg, SpO2 100 %. Physical Exam  Vitals reviewed. Constitutional: She is oriented to person, place, and time. She appears well-developed and well-nourished.  HENT:  Head: Normocephalic and atraumatic.  Eyes: Pupils are equal, round, and reactive to light. Conjunctivae and EOM are normal. Right eye exhibits no discharge.  Neck: Normal range of motion. Neck supple. No tracheal deviation present. No thyromegaly present.  Cardiovascular: Normal rate and regular rhythm.  Respiratory: Effort normal and breath sounds normal. No stridor. No respiratory distress.  GI: Soft. She exhibits no distension. Bowel sounds are decreased. There is no abdominal tenderness.  Musculoskeletal:        General: No edema.     Comments: LE edema.  Right knee with arthritic changes and well healed incisions.   Neurological: She is alert and oriented to person, place, and time. No cranial nerve deficit.  Motor: Bilateral upper extremities: 5/5 proximal to distal Right lower extremity for hip flexion, knee extension 2-/5, ankle dorsiflexion 4-/5 Left lower extremity: Hip flexion, knee extension 3 -/5, ankle dorsiflexion 4/5  Skin: Skin is warm and dry.  Psychiatric: She has a normal mood and affect. Her behavior is normal.    No results found for this or any previous visit (from the past 48 hour(s)). No results found.     Medical Problem List and Plan: 1.  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  Admit to CIR 2.  Antithrombotics: -DVT/anticoagulation:  Mechanical: Sequential compression devices, below knee Bilateral lower extremities   CBC with differential tomorrow a.m.  -antiplatelet therapy: N/A 3. Pain Management: Will d/c Celebrex as likely leading to regurgitation/acid reflux  Monitor with increased activity and upright posture. 4. Mood: LCSW to follow for evaluation and support.    -antipsychotic agents: NA 5. Neuropsych: This patient is capable of making decisions on her own behalf. 6. Skin/Wound Care:  Routine pressure relief measures.  7. Fluids/Electrolytes/Nutrition: Monitor I/O. CMP ordered for tomorrow AM. 8.  Hypokalemia: Needs post op labs-- BMP ordered for tomorrow a.m. 9.  AKI versus CKD:  Needs post op labs. BMP ordered for tomorrow a.m. 10. Severe GERD: Will schedule Protonix as IV BID. 11. HTN: Monitor BP tid--stable on amlodipine and Avapro/HCTZ.    Monitor with increased mobility.  12. Reactive depression: Feels fine at the moment but has had a hard time past 6 months. Will resume at lower dose and monitor.  Team to provide support.  13. Constipation: Question ileus--KUB ordered as patient has been unable to eat with severe GERD. Add miralax daily and enema today. Reglan if needed  , PA-C 10/15/2018   I have personally performed a face to face diagnostic evaluation, including, but not limited to relevant history and physical exam findings, of this patient and developed relevant assessment and plan.  Additionally, I have reviewed and concur with the physician assistant's documentation above.  10/17/2018, MD, ABPMR  The patient's status has not changed. The original post admission physician evaluation remains appropriate, and any changes from the  pre-admission screening or documentation from the acute chart are noted above.   Delice Lesch, MD, ABPMR

## 2018-10-15 NOTE — Progress Notes (Signed)
Report given to IPR.  Patient transferred down with all belongings.

## 2018-10-15 NOTE — Progress Notes (Addendum)
Inpatient Rehabilitation Admissions Coordinator  I have insurance approval to admit patient to inpt rehab today. I met with patient and her spouse at bedside and they are in agree,emt. I will contact Dr. Christella Noa to arrange d/c to CIR today. I have notified RN CM ,Lanelle Bal of planned d/c to CIR.  Danne Baxter, RN, MSN Rehab Admissions Coordinator [phone] 10/15/2018 11:13 AM   I have placed a call to Dr. Christella Noa and await his return call to clarify if I can plan admit to CIR today.  Danne Baxter, RN, MSN Rehab Admissions Coordinator [phone] 10/15/2018 12:17 PM

## 2018-10-15 NOTE — TOC Transition Note (Signed)
Transition of Care [facility]) - CM/SW Discharge Note   Patient Details  Name: Alicia Valdez MRN: [id number] Date of Birth: 10/26/1935  [facility]) CM/SW Contact:  Midge Minium RN, BSN, NCM-BC, ACM-RN [phone] Phone Number: 10/15/2018, 12:35 PM   Clinical Narrative:    CM informed by Danne Baxter RN (Rehab [facility]) that insurance Josem Kaufmann was received for CIR with a bed available today. No further needs from CM.    Final next level of care: IP Rehab Facility Barriers to Discharge: No Barriers Identified   Patient Goals and CMS Choice Patient states their goals for this hospitalization and ongoing recovery are:: "get back to normal"   Choice offered to / list presented to : Patient(Discussed by Rehab [facility])  Discharge Placement              Patient chooses bed at: Other - please specify in the comment section below:([facility])        Discharge Plan and Services In-house Referral: NA Discharge Planning Services: CM Consult Post Acute Care Choice: IP Rehab             Social Determinants of Health (SDOH) Interventions     Readmission Risk Interventions No flowsheet data found.

## 2018-10-15 NOTE — Progress Notes (Signed)
Called reports having hard time breathing- entered room and appears panic strickened- but not in resp distress- she rants about someone supposed to come in to bathe her she can"t take the itching anymore. Rants about being in anaphylatic shock oxygen sat is 97% on RA- heart rate 72. Had given her 1 benadryl earlier for itcing- she can have 2 so brought her another. Pt denies be prone to anxiety or panic attacks

## 2018-10-15 NOTE — Progress Notes (Signed)
Jamse Arn, MD    Jamse Arn, MD  Physician  Physical Medicine and Rehabilitation     PMR Pre-admission  Signed     Date of Service:  10/14/2018  1:25 PM        Related encounter: Admission (Current) from 10/09/2018 in [place] buttonCollapse widget button    Show:Clear all   ManualTemplateCopied  Added by:     Cristina Gong, RN  Jamse Arn, MD   Hover for detailscustomization button                                                                                                                                                                                              PMR Admission Coordinator Pre-Admission Assessment     Patient: Alicia Valdez is an 83 y.o., female  MRN: [id number]  DOB: 12-22-1935  Height: 5' 2"  (157.5 cm)  Weight: 64 kg     Insurance Information  HMO:     PPO: yes     PCP:      IPA:      80/20:      OTHER: medicare advantage plan  PRIMARY: [employer] Medicare      Policy#: [id number]      Subscriber: pt  CM Name: Jolayne Haines      Phone#: [phone] ext [phone]     Fax#: [phone]  Pre-Cert#: [id number] approved until  10/21 with f/u  Kierra phone ext [phone] and same fax     Employer:   Benefits:  Phone #: [phone]     Name: 10/13  Eff. Date: 01/01/2017     Deduct: none      Out of Pocket Max: [phone]      Life Max: none  CIR: $450 co pay per day days 1 until 4      SNF: no co pay days 1 until 20; $178 co pay per days 21 until 100  Outpatient: $20 to $40 per visit     Co-Pay: visits per medical neccesity  Home Health: 100%      Co-Pay: visits per medical neccesity  DME: 805      Co-Pay: 205  Providers: in network   SECONDARY: none         Medicaid Application Date:  Case Manager:   Disability Application Date:       Case Worker:      The Data Collection Information Summary for patients in Inpatient Rehabilitation Facilities with attached Privacy Act [place] Records was provided and verbally reviewed with: Patient     Emergency Contact Information           Contact Information            Name    Relation    Home    Work    [place]   Spouse   [phone]       [phone]                 Current Medical History   Patient Admitting Diagnosis: debility, CSF leak; reexploration L4-5     History of Present Illness: 83 year old female with past medical history of arthritis, back surgery, skiing accident 15 years ago with multiple broken bones, pleurisy and transverse myelitis. Also Hiatal hernia, GERD, essential HTN, Iron deficiency anemia and lumbosacral DDD with history of kyphoplasty.     Presented on 10/09/2018 for evaluation of a CSF leak after pain management with epidural steroid injections. Had injection July of 2019 and 2nd injection later that year. Has undergone blood patches both in August and October of 2019 with neither terminating the CSF leak. Underwent right lumbar 4 to 5 reexploration for CSF fluid leak with known CSF collection anterior to the sac at L4/5. Postoperatively with left lower extremity felt better, but right LE very weak at her hip flexors, quads, but dorsiflexion and plantar flexion stronger than the proximal musculature.      Patient's medical record from [facility]  has been reviewed by the rehabilitation admission coordinator and physician.     Past Medical History        Past Medical History:    Diagnosis   Date       Anemia           GERD (gastroesophageal reflux disease)            Hypertension           Transverse myelitis ([place])   2016          Family History    family history is not on file.     Prior Rehab/Hospitalizations  Has the patient had prior rehab or hospitalizations prior to admission? Yes  [facility] 2017 after dx transverse Myelitis and [facility] 2018 for same     Has the patient had major surgery during 100 days prior to admission? Yes                Current Medications     Current Facility-Administered Medications:     0.9 %  sodium chloride infusion, 250 mL, Intravenous, Continuous, Cabbell, Kyle, MD    0.9 % NaCl with KCl 20 mEq/ L  infusion, , Intravenous, Continuous, Ashok Pall, MD    acetaminophen (TYLENOL) tablet 650 mg, 650 mg, Oral, Q4H PRN, 650 mg at 10/13/18 1618 **OR** acetaminophen (TYLENOL) suppository 650 mg, 650 mg, Rectal, Q4H PRN, Ashok Pall, MD    alum & mag hydroxide-simeth (MAALOX/MYLANTA) 200-200-20 MG/5ML suspension 30 mL, 30 mL, Oral, Q4H PRN, Ashok Pall, MD, 30 mL at 10/14/18 1418    amLODipine (NORVASC) tablet 10 mg, 10 mg, Oral, Daily, Ashok Pall, MD, 10 mg at 10/15/18 1119    butalbital-acetaminophen-caffeine (FIORICET) 50-325-40 MG per  tablet 1 tablet, 1 tablet, Oral, BID, Ashok Pall, MD, 1 tablet at 10/15/18 1120    celecoxib (CELEBREX) capsule 200 mg, 200 mg, Oral, Q12H, Ashok Pall, MD, 200 mg at 10/15/18 1120    cloNIDine (CATAPRES) tablet 0.2 mg, 0.2 mg, Oral, Q6H PRN, Ashok Pall, MD    cyclobenzaprine (FLEXERIL) tablet 10 mg, 10 mg, Oral, TID PRN, Ashok Pall, MD    desmopressin (DDAVP) tablet 0.1 mg, 0.1 mg, Oral, QHS, Ashok Pall, MD, 0.1 mg at 10/14/18 2213    diphenhydrAMINE (BENADRYL) capsule 25-50 mg, 25-50 mg, Oral, Q6H PRN, Ashok Pall, MD, 25 mg at 10/15/18 0153    irbesartan (AVAPRO) tablet 75 mg, 75 mg, Oral, Daily, 75 mg at 10/15/18 1120 **AND** hydrochlorothiazide (MICROZIDE) capsule 12.5 mg, 12.5 mg, Oral, Daily, Ashok Pall, MD, 12.5 mg at 10/15/18 1119    menthol-cetylpyridinium (CEPACOL) lozenge 3 mg, 1 lozenge, Oral, PRN **OR** phenol (CHLORASEPTIC) mouth spray 1 spray, 1 spray, Mouth/Throat, PRN, Ashok Pall, MD    ondansetron (ZOFRAN) tablet 4 mg, 4 mg, Oral, Q6H PRN, 4 mg at 10/14/18 0822 **OR** ondansetron (ZOFRAN) injection 4 mg, 4 mg, Intravenous, Q6H PRN, Ashok Pall, MD, 4 mg at 10/14/18 1416    oxyCODONE (Oxy IR/ROXICODONE) immediate release tablet 10 mg, 10 mg, Oral, Q3H PRN, Ashok Pall, MD, 10 mg at 10/12/18 1933    oxyCODONE (Oxy IR/ROXICODONE) immediate release tablet 5 mg, 5 mg, Oral, Q3H PRN, Ashok Pall, MD, 5 mg at 10/15/18 1349    pantoprazole (PROTONIX) EC tablet 20 mg, 20 mg, Oral, BID PRN, Ashok Pall, MD    polyethylene glycol (MIRALAX / GLYCOLAX) packet 17 g, 17 g, Oral, Daily PRN, Ashok Pall, MD, 17 g at 10/14/18 2212    sodium chloride flush (NS) 0.9 % injection 3 mL, 3 mL, Intravenous, Q12H, Ashok Pall, MD, 3 mL at 10/15/18 1121    sodium chloride flush (NS) 0.9 % injection 3 mL, 3 mL, Intravenous, PRN, Ashok Pall, MD    sorbitol 70 % solution 30 mL, 30 mL, Oral, Daily PRN, Ashok Pall, MD, 30 mL at 10/14/18 2212     Patients Current Diet:       Diet Order                                       Diet regular Room service appropriate? Yes; Fluid consistency: Thin  Diet effective now                               Precautions / Restrictions  Precautions  Precautions: Back  Precaution Booklet Issued: Yes (comment)  Precaution Comments: reviewed all precautions; pt verbalized understanding, but noted some difficulty putting precautions into practice with functional mobility  Other Brace: Success with prefab AFO trial  Restrictions  Weight Bearing Restrictions: No      Has the patient had 2 or more falls or a fall with injury in the past year? Yes, patient reports 1 to 2 falls weekly over the  past year when her legs give out.      Prior Activity Level  Community (5-7x/wk): decline in function over past year. Uses RW, does not drive, frequent falls. I adls     Prior Functional Level  Self Care: Did the patient need help bathing, dressing, using the toilet or eating? Independent     Indoor  Mobility: Did the patient need assistance with walking from room to room (with or without device)? Independent     Stairs: Did the patient need assistance with internal or external stairs (with or without device)? Needed some help     Functional Cognition: Did the patient need help planning regular tasks such as shopping or remembering to take medications? [place] / Equipment  Home Assistive Devices/Equipment: Environmental consultant (specify type), Cane (specify quad or straight)  Home Equipment: Shower seat, Hand held shower head, Cane - quad, Walker - 4 wheels     Prior Device Use: Indicate devices/aids used by the patient prior to current illness, exacerbation or injury? Walker and cane     Current Functional Level   Cognition      Overall Cognitive Status: Within Functional Limits for tasks assessed  Orientation Level: Oriented X4  General Comments: feeling slightly frustrated about the fact that she hasn't walked yet       Extremity Assessment  (includes Sensation/Coordination)      Upper Extremity Assessment: Defer to OT evaluation   Lower Extremity Assessment: Generalized weakness, RLE deficits/detail  RLE Deficits / Details: Pt reports difficulty moving RLE and a difference in sensation. Reports feeling pressure, but it does not feel normal in comparison to LLE. Ankle dorsiflexion2/5, knee extension 2+/5  RLE Sensation: decreased light touch  RLE Coordination: decreased gross motor        ADLs      Overall ADL's : Needs assistance/impaired  Eating/Feeding: Independent, Sitting  Grooming: Wash/dry face, Sitting, Set  up  Upper Body Bathing: Supervision/ safety, Sitting  Lower Body Bathing: Sit to/from stand, Maximal assistance, +2 for physical assistance, +2 for safety/equipment  Upper Body Dressing : Supervision/safety, Sitting  Lower Body Dressing: Total assistance, Sitting/lateral leans  Lower Body Dressing Details (indicate cue type and reason): total A d/t back precautions  Toilet Transfer: Moderate assistance, BSC, RW, Stand-pivot  Toilet Transfer Details (indicate cue type and reason): MOD A to advance RLE during stand pivot transfer; pt with flexed posture throuhgout transfer; verbal/ tactile cues to self- correct  Toileting- Clothing Manipulation and Hygiene: Set up, Sitting/lateral lean  Toileting - Clothing Manipulation Details (indicate cue type and reason): able to complete sitting anterior pericare using lateral leans from recliner  Functional mobility during ADLs: Minimal assistance, +2 for physical assistance, +2 for safety/equipment, Rolling walker  General ADL Comments: supervision UB ADL; MAX-Total LB ADL        Mobility      Overal bed mobility: Needs Assistance  Bed Mobility: Rolling, Sidelying to Sit  Rolling: Min assist  Sidelying to sit: Mod assist  General bed mobility comments: cues to initiate log roll; MOD A to elevate trunk and scoot hips forward        Transfers      Overall transfer level: Needs assistance  Equipment used: Rolling walker (2 wheeled)  Transfer via Lift Equipment: Stedy  Transfers: Sit to/from Stand  Sit to Stand: Mod assist, Min assist  Stand pivot transfers: Mod assist  General transfer comment: Mod assist to stand from bed; min assist to stand from recliner -- armrests make the difference, they give a stable platform for her to push up from        Ambulation / Gait / Stairs / Wheelchair Mobility      Ambulation/Gait  Ambulation/Gait assistance: Min assist, Mod assist  Gait Distance (Feet): 25 Feet(10 feet  then 15 feet)  Assistive device: Rolling  walker (2 wheeled)  Gait Pattern/deviations: Ataxic  General Gait Details: Much improved RLE advancement with AFO for dorsiflexion assist and better knee stability; did not need physical assist to advance RLE today; Had to assist and cue pt for sequencing steps and RW.  Pt kept stepping too far into RW and getting off balance.  Pt needed assist to steer RW as well.   Gait velocity: slowed  Gait velocity interpretation: <1.31 ft/sec, indicative of household ambulator        Posture / Balance   Dynamic Sitting Balance  Sitting balance - Comments: able to static sit EOB  Balance  Overall balance assessment: Needs assistance  Sitting-balance support: Feet supported, No upper extremity supported  Sitting balance-Leahy Scale: Fair  Sitting balance - Comments: able to static sit EOB  Standing balance support: Bilateral upper extremity supported  Standing balance-Leahy Scale: Poor  Standing balance comment: reliant on BUE support in standing as well as support on RW        Special needs/care consideration   BiPAP/CPAP  N/a  CPM  N/a  Continuous Drip IV  N/a  Dialysis n/a  Life Vest  N/a  Oxygen  N/a  Special Bed  N/a  Trach Size  N/a  Wound Vac n/a  Skin surgical incision  Bowel mgmt:  Constipated; continent LBM 10/8*  Bladder mgmt: external catheter  Diabetic mgmt: n/a  Behavioral consideration  N/a  Chemo/radiation  N/a  Designated visitor spouse, Elenore Rota       Previous Home Environment   Living Arrangements: Spouse/significant other   Lives With: Spouse  Available Help at Discharge: Family, Available 24 hours/day  Type of Home: House  Home Layout: Multi-level, Able to live on main level with bedroom/bathroom, Full bath on main level(3 levels)  Alternate Level Stairs-Number of Steps: Flight with chair lift  Home Access: Other (comment)(chair lift from garage into main level of  home)  Bathroom Shower/Tub: Walk-in shower, Tub only  Biochemist, clinical: Standard  Bathroom Accessibility: Yes  How Accessible: Accessible via walker  [place]: No  Additional Comments: initial CSF leak October 2019     Discharge Living Setting  Plans for Discharge Living Setting: Patient's home, Lives with (comment)(spouse)  Type of Home at Discharge: House  Discharge Home Layout: Multi-level, Full bath on main level, Able to live on main level with bedroom/bathroom  Alternate Level Stairs-Rails: (satys on main level only)  Discharge Home Access: Other (comment)(chair leift on stairs from garage to main level entry)  Discharge Bathroom Shower/Tub: Walk-in shower  Discharge Bathroom Toilet: Standard  Discharge Bathroom Accessibility: Yes  Does the patient have any problems obtaining your medications?: No     Social/Family/Support Systems  Patient Roles: Spouse, Parent(4 children , non local to [place], [place])  Contact Information: spouse, Elenore Rota  Anticipated Caregiver: 17 year old spouse  Anticipated Caregiver's Contact Information: see above  Ability/Limitations of Caregiver: no limitations  Caregiver Availability: 24/7  Discharge Plan Discussed with Primary Caregiver: Yes  Is Caregiver In Agreement with Plan?: Yes  Does Caregiver/Family have Issues with Lodging/Transportation while Pt is in Rehab?: No      Spouse staying at local hotel when in town visiting and then [place]. They have a local son in [place] which I have encouraged him to stay there locally. Spouse is 59 years old and uses a cane.     Goals/Additional Needs  Patient/Family Goal for Rehab: supervision PT, supervision to min OT  Expected length of  stay: ELOS 10 to 14 days  Pt/Family Agrees to Admission and willing to participate: Yes  Program Orientation Provided & Reviewed with Pt/Caregiver Including Roles  & Responsibilities: Yes      Decrease burden of Care through IP rehab admission: n/a     Possible need for SNF placement upon discharge: not expected     Patient Condition: I have reviewed medical records from [facility], spoken with patient. I met with patient at the bedside for inpatient rehabilitation assessment.  Patient will benefit from ongoing PT and OT, can actively participate in 3 hours of therapy a day 5 days of the week, and can make measurable gains during the admission.  Patient will also benefit from the coordinated team approach during an Inpatient Acute Rehabilitation admission.  The patient will receive intensive therapy as well as Rehabilitation physician, nursing, social worker, and care management interventions.  Due to bladder management, bowel management, safety, skin/wound care, disease management, medication administration, pain management and patient education the patient requires 24 hour a day rehabilitation nursing.  The patient is currently mod assist with mobility and basic ADLs.  Discharge setting and therapy post discharge at home with home health is anticipated.  Patient has agreed to participate in the Acute Inpatient Rehabilitation Program and will admit today.     Preadmission Screen Completed By:  Cleatrice Burke, 10/15/2018 1:49 PM  ______________________________________________________________________    Discussed status with Dr. Posey Pronto  on 10/15/2018 at 1433 and received approval for admission today.     Admission Coordinator:  Cleatrice Burke, RN, time  0923 Date 10/15/2018      Assessment/Plan:  Diagnosis: Lumbar reexploration after repair of CSF leak  1.Does the need for close, 24 hr/day Medical supervision in concert with the patient's rehab needs make it unreasonable for this patient to be served in a less intensive setting? Yes   2.Co-Morbidities requiring supervision/potential complications: arthritis, back surgery, skiing accident 15 years  ago with multiple broken bones, pleurisy and transverse myelitis   3.Due to bladder management, bowel management, safety, skin/wound care, disease management, pain management and patient education, does the patient require 24 hr/day rehab nursing? Yes   4.Does the patient require coordinated care of a physician, rehab nurse, PT, OT to address physical and functional deficits in the context of the above medical diagnosis(es)? Yes Addressing deficits in the following areas: balance, endurance, locomotion, strength, transferring, bathing, dressing, toileting and psychosocial support   5.Can the patient actively participate in an intensive therapy program of at least 3 hrs of therapy 5 days a week? Yes   6.The potential for patient to make measurable gains while on inpatient rehab is excellent   7.Anticipated functional outcomes upon discharge from inpatient rehab: supervision and min assist PT, supervision and min assist OT, n/a SLP   8.Estimated rehab length of stay to reach the above functional goals is: 12-16 days.   9.Anticipated discharge destination: Home   10. Overall Rehab/Functional Prognosis: good        MD Signature:  Delice Lesch, MD, ABPMR               Revision History       Jamse Arn, MD  Physician  Physical Medicine and Rehabilitation  PMR Pre-admission  Signed  Date of Service:  10/14/2018 1:25 PM      Related encounter: Admission (Current) from 10/09/2018 in [place]  Show:Clear all [x] Manual[x] Template[] Copied  Added by: [x] Cristina Gong, RN[x] Jamse Arn, MD  [] Hover for details PMR Admission Coordinator Pre-Admission Assessment  Patient: Alicia Valdez is an 83 y.o., female MRN: [id number] DOB: 01-Apr-1935 Height: 5' 2"  (157.5 cm) Weight: 64 kg  Insurance Information HMO:     PPO: yes     PCP:      IPA:      80/20:      OTHER: medicare advantage  plan PRIMARY: [employer] Medicare      Policy#: [id number]      Subscriber: pt CM Name: Jolayne Haines      Phone#: [phone] ext [phone]     Fax#: [phone] Pre-Cert#: [id number] approved until  10/21 with f/u  [facility]ierra phone ext [phone] and same fax     Employer:  Benefits:  Phone #: [phone]     Name: 10/13 Eff. Date: 01/01/2017     Deduct: none      Out of Pocket Max: [phone]      Life Max: none CIR: $450 co pay per day days 1 until 4      SNF: no co pay days 1 until 20; $178 co pay per days 21 until 100 Outpatient: $20 to $40 per visit     Co-Pay: visits per medical neccesity Home Health: 100%      Co-Pay: visits per medical neccesity DME: 805     Co-Pay: 205 Providers: in network  SECONDARY: none      Medicaid Application Date:       Case Manager:  Disability Application Date:       Case Worker:   The Data Collection Information Summary for patients in Inpatient Rehabilitation Facilities with attached Privacy Act [place] Records was provided and verbally reviewed with: Patient  Emergency Contact Information         Contact Information    Name Relation Home Work [place] Spouse [phone]  [phone]      Current Medical History  Patient Admitting Diagnosis: debility, CSF leak; reexploration L4-5  History of Present Illness: 83 year old female with past medical history of arthritis, back surgery, skiing accident 15 years ago with multiple broken bones, pleurisy and transverse myelitis. Also Hiatal hernia, GERD, essential HTN, Iron deficiency anemia and lumbosacral DDD with history of kyphoplasty.  Presented on 10/09/2018 for evaluation of a CSF leak after pain management with epidural steroid injections. Had injection July of 2019 and 2nd injection later that year. Has undergone blood patches both in August and October of 2019 with neither terminating the CSF leak. Underwent right lumbar 4 to 5 reexploration for CSF fluid leak with known CSF  collection anterior to the sac at L4/5. Postoperatively with left lower extremity felt better, but right LE very weak at her hip flexors, quads, but dorsiflexion and plantar flexion stronger than the proximal musculature.   Patient's [facility] record from [facility]  has been reviewed by the rehabilitation admission coordinator and physician.  Past Medical History      Past Medical History:  Diagnosis Date   Anemia    GERD (gastroesophageal reflux disease)    Hypertension    Transverse myelitis ([place]) 2016    Family History   family history is not on file.  Prior Rehab/Hospitalizations Has the patient had prior rehab or hospitalizations prior to admission? Yes [facility] 2017 after dx transverse Myelitis and [facility] 2018 for same  Has the patient had major surgery during 100 days prior to admission? Yes  Current Medications  Current Facility-Administered Medications:    0.9 %  sodium chloride infusion, 250 mL, Intravenous, Continuous, Cabbell, Kyle, MD   0.9 % NaCl with KCl 20 mEq/ L  infusion, , Intravenous, Continuous, Ashok Pall, MD   acetaminophen (TYLENOL) tablet 650 mg, 650 mg, Oral, Q4H PRN, 650 mg at 10/13/18 1618 **OR** acetaminophen (TYLENOL) suppository 650 mg, 650 mg, Rectal, Q4H PRN, Ashok Pall, MD   alum & mag hydroxide-simeth (MAALOX/MYLANTA) 200-200-20 MG/5ML suspension 30 mL, 30 mL, Oral, Q4H PRN, Ashok Pall, MD, 30 mL at 10/14/18 1418   amLODipine (NORVASC) tablet 10 mg, 10 mg, Oral, Daily, Ashok Pall, MD, 10 mg at 10/15/18 1119   butalbital-acetaminophen-caffeine (FIORICET) 50-325-40 MG per tablet 1 tablet, 1 tablet, Oral, BID, Ashok Pall, MD, 1 tablet at 10/15/18 1120   celecoxib (CELEBREX) capsule 200 mg, 200 mg, Oral, Q12H, Ashok Pall, MD, 200 mg at 10/15/18 1120   cloNIDine (CATAPRES) tablet 0.2 mg, 0.2 mg, Oral, Q6H PRN, Ashok Pall, MD   cyclobenzaprine (FLEXERIL) tablet 10 mg, 10 mg, Oral,  TID PRN, Ashok Pall, MD   desmopressin (DDAVP) tablet 0.1 mg, 0.1 mg, Oral, QHS, Ashok Pall, MD, 0.1 mg at 10/14/18 2213   diphenhydrAMINE (BENADRYL) capsule 25-50 mg, 25-50 mg, Oral, Q6H PRN, Ashok Pall, MD, 25 mg at 10/15/18 0153   irbesartan (AVAPRO) tablet 75 mg, 75 mg, Oral, Daily, 75 mg at 10/15/18 1120 **AND** hydrochlorothiazide (MICROZIDE) capsule 12.5 mg, 12.5 mg, Oral, Daily, Ashok Pall, MD, 12.5 mg at 10/15/18 1119   menthol-cetylpyridinium (CEPACOL) lozenge 3 mg, 1 lozenge, Oral, PRN **OR** phenol (CHLORASEPTIC) mouth spray 1 spray, 1 spray, Mouth/Throat, PRN, Ashok Pall, MD   ondansetron (ZOFRAN) tablet 4 mg, 4 mg, Oral, Q6H PRN, 4 mg at 10/14/18 0822 **OR** ondansetron (ZOFRAN) injection 4 mg, 4 mg, Intravenous, Q6H PRN, Ashok Pall, MD, 4 mg at 10/14/18 1416   oxyCODONE (Oxy IR/ROXICODONE) immediate release tablet 10 mg, 10 mg, Oral, Q3H PRN, Ashok Pall, MD, 10 mg at 10/12/18 1933   oxyCODONE (Oxy IR/ROXICODONE) immediate release tablet 5 mg, 5 mg, Oral, Q3H PRN, Ashok Pall, MD, 5 mg at 10/15/18 1349   pantoprazole (PROTONIX) EC tablet 20 mg, 20 mg, Oral, BID PRN, Ashok Pall, MD   polyethylene glycol (MIRALAX / GLYCOLAX) packet 17 g, 17 g, Oral, Daily PRN, Ashok Pall, MD, 17 g at 10/14/18 2212   sodium chloride flush (NS) 0.9 % injection 3 mL, 3 mL, Intravenous, Q12H, Ashok Pall, MD, 3 mL at 10/15/18 1121   sodium chloride flush (NS) 0.9 % injection 3 mL, 3 mL, Intravenous, PRN, Ashok Pall, MD   sorbitol 70 % solution 30 mL, 30 mL, Oral, Daily PRN, Ashok Pall, MD, 30 mL at 10/14/18 2212  Patients Current Diet:     Diet Order                  Diet regular Room service appropriate? Yes; Fluid consistency: Thin  Diet effective now               Precautions / Restrictions Precautions Precautions: Back Precaution Booklet Issued: Yes (comment) Precaution Comments: reviewed all precautions; pt verbalized understanding,  but noted some difficulty putting precautions into practice with functional mobility Other Brace: Success with prefab AFO trial Restrictions Weight Bearing Restrictions: No   Has the patient had 2 or more falls or a fall with injury in the past year? Yes, patient reports 1 to 2 falls weekly over the past year when her legs give  out.   Prior Activity Level Community (5-7x/wk): decline in function over past year. Uses RW, does not drive, frequent falls. I adls  Prior Functional Level Self Care: Did the patient need help bathing, dressing, using the toilet or eating? Independent  Indoor Mobility: Did the patient need assistance with walking from room to room (with or without device)? Independent  Stairs: Did the patient need assistance with internal or external stairs (with or without device)? Needed some help  Functional Cognition: Did the patient need help planning regular tasks such as shopping or remembering to take medications? [place] / Equipment Home Assistive Devices/Equipment: Environmental consultant (specify type), Cane (specify quad or straight) Home Equipment: Shower seat, Hand held shower head, Cane - quad, Walker - 4 wheels  Prior Device Use: Indicate devices/aids used by the patient prior to current illness, exacerbation or injury? Walker and cane  Current Functional Level Cognition  Overall Cognitive Status: Within Functional Limits for tasks assessed Orientation Level: Oriented X4 General Comments: feeling slightly frustrated about the fact that she hasn't walked yet    Extremity Assessment (includes Sensation/Coordination)  Upper Extremity Assessment: Defer to OT evaluation  Lower Extremity Assessment: Generalized weakness, RLE deficits/detail RLE Deficits / Details: Pt reports difficulty moving RLE and a difference in sensation. Reports feeling pressure, but it does not feel normal in comparison to LLE. Ankle dorsiflexion2/5, knee extension  2+/5 RLE Sensation: decreased light touch RLE Coordination: decreased gross motor    ADLs  Overall ADL's : Needs assistance/impaired Eating/Feeding: Independent, Sitting Grooming: Wash/dry face, Sitting, Set up Upper Body Bathing: Supervision/ safety, Sitting Lower Body Bathing: Sit to/from stand, Maximal assistance, +2 for physical assistance, +2 for safety/equipment Upper Body Dressing : Supervision/safety, Sitting Lower Body Dressing: Total assistance, Sitting/lateral leans Lower Body Dressing Details (indicate cue type and reason): total A d/t back precautions Toilet Transfer: Moderate assistance, BSC, RW, Stand-pivot Toilet Transfer Details (indicate cue type and reason): MOD A to advance RLE during stand pivot transfer; pt with flexed posture throuhgout transfer; verbal/ tactile cues to self- correct Toileting- Clothing Manipulation and Hygiene: Set up, Sitting/lateral lean Toileting - Clothing Manipulation Details (indicate cue type and reason): able to complete sitting anterior pericare using lateral leans from recliner Functional mobility during ADLs: Minimal assistance, +2 for physical assistance, +2 for safety/equipment, Rolling walker General ADL Comments: supervision UB ADL; MAX-Total LB ADL    Mobility  Overal bed mobility: Needs Assistance Bed Mobility: Rolling, Sidelying to Sit Rolling: Min assist Sidelying to sit: Mod assist General bed mobility comments: cues to initiate log roll; MOD A to elevate trunk and scoot hips forward    Transfers  Overall transfer level: Needs assistance Equipment used: Rolling walker (2 wheeled) Transfer via Lift Equipment: Stedy Transfers: Sit to/from Stand Sit to Stand: Mod assist, Min assist Stand pivot transfers: Mod assist General transfer comment: Mod assist to stand from bed; min assist to stand from recliner -- armrests make the difference, they give a stable platform for her to push up from    Ambulation / Gait / Stairs  / Wheelchair Mobility  Ambulation/Gait Ambulation/Gait assistance: Min assist, Mod assist Gait Distance (Feet): 25 Feet(10 feet then 15 feet) Assistive device: Rolling walker (2 wheeled) Gait Pattern/deviations: Ataxic General Gait Details: Much improved RLE advancement with AFO for dorsiflexion assist and better knee stability; did not need physical assist to advance RLE today; Had to assist and cue pt for sequencing steps and RW.  Pt kept stepping too far into RW and  getting off balance.  Pt needed assist to steer RW as well.  Gait velocity: slowed Gait velocity interpretation: <1.31 ft/sec, indicative of household ambulator    Posture / Balance Dynamic Sitting Balance Sitting balance - Comments: able to static sit EOB Balance Overall balance assessment: Needs assistance Sitting-balance support: Feet supported, No upper extremity supported Sitting balance-Leahy Scale: Fair Sitting balance - Comments: able to static sit EOB Standing balance support: Bilateral upper extremity supported Standing balance-Leahy Scale: Poor Standing balance comment: reliant on BUE support in standing as well as support on RW    Special needs/care consideration BiPAP/CPAP  N/a CPM  N/a Continuous Drip IV  N/a Dialysis n/a Life Vest  N/a Oxygen  N/a Special Bed  N/a Trach Size  N/a Wound Vac n/a Skin surgical incision Bowel mgmt:  Constipated; continent LBM 10/8* Bladder mgmt: external catheter Diabetic mgmt: n/a Behavioral consideration  N/a Chemo/radiation  N/a Designated visitor spouse, Elenore Rota   Previous Home Environment  Living Arrangements: Spouse/significant other  Lives With: Spouse Available Help at Discharge: Family, Available 24 hours/day Type of Home: House Home Layout: Multi-level, Able to live on main level with bedroom/bathroom, Full bath on main level(3 levels) Alternate Level Stairs-Number of Steps: Flight with chair lift Home Access: Other (comment)(chair lift from garage  into main level of home) Bathroom Shower/Tub: Walk-in shower, Tub only Biochemist, clinical: Standard Bathroom Accessibility: Yes How Accessible: Accessible via walker [place]: No Additional Comments: initial CSF leak October 2019  Discharge Living Setting Plans for Discharge Living Setting: Patient's home, Lives with (comment)(spouse) Type of Home at Discharge: House Discharge Home Layout: Multi-level, Full bath on main level, Able to live on main level with bedroom/bathroom Alternate Level Stairs-Rails: (satys on main level only) Discharge Home Access: Other (comment)(chair leift on stairs from garage to main level entry) Discharge Bathroom Shower/Tub: Walk-in shower Discharge Bathroom Toilet: Standard Discharge Bathroom Accessibility: Yes Does the patient have any problems obtaining your medications?: No  Social/Family/Support Systems Patient Roles: Spouse, Parent(4 children , non local to [place], [place]) Contact Information: spouse, Elenore Rota Anticipated Caregiver: 81 year old spouse Anticipated Caregiver's Contact Information: see above Ability/Limitations of Caregiver: no limitations Caregiver Availability: 24/7 Discharge Plan Discussed with Primary Caregiver: Yes Is Caregiver In Agreement with Plan?: Yes Does Caregiver/Family have Issues with Lodging/Transportation while Pt is in Rehab?: No   Spouse staying at local hotel when in town visiting and then [place]. They have a local son in [place] which I have encouraged him to stay there locally. Spouse is 82 years old and uses a cane.  Goals/Additional Needs Patient/Family Goal for Rehab: supervision PT, supervision to min OT Expected length of stay: ELOS 10 to 14 days Pt/Family Agrees to Admission and willing to participate: Yes Program Orientation Provided & Reviewed with Pt/Caregiver Including Roles  & Responsibilities: Yes  Decrease burden of Care through IP rehab admission:  n/a  Possible need for SNF placement upon discharge: not expected  Patient Condition: I have reviewed medical records from [facility], spoken with patient. I met with patient at the bedside for inpatient rehabilitation assessment.  Patient will benefit from ongoing PT and OT, can actively participate in 3 hours of therapy a day 5 days of the week, and can make measurable gains during the admission.  Patient will also benefit from the coordinated team approach during an Inpatient Acute Rehabilitation admission.  The patient will receive intensive therapy as well as Rehabilitation physician, nursing, social worker, and care  management interventions.  Due to bladder management, bowel management, safety, skin/wound care, disease management, medication administration, pain management and patient education the patient requires 24 hour a day rehabilitation nursing.  The patient is currently mod assist with mobility and basic ADLs.  Discharge setting and therapy post discharge at home with home health is anticipated.  Patient has agreed to participate in the Acute Inpatient Rehabilitation Program and will admit today.  Preadmission Screen Completed By:  Cleatrice Burke, 10/15/2018 1:49 PM ______________________________________________________________________   Discussed status with Dr. Posey Pronto  on 10/15/2018 at 1433 and received approval for admission today.  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 Does the need for close, 24 hr/day Medical supervision in concert with the patient's rehab needs make it unreasonable for this patient to be served in a less intensive setting? Yes 2. Co-Morbidities requiring supervision/potential complications: arthritis, back surgery, skiing accident 15 years ago with multiple broken bones, pleurisy and transverse myelitis 3. Due to bladder  management, bowel management, safety, skin/wound care, disease management, pain management and patient education, does the patient require 24 hr/day rehab nursing? Yes 4. Does the patient require coordinated care of a physician, rehab nurse, PT, OT to address physical and functional deficits in the context of the above medical diagnosis(es)? Yes Addressing deficits in the following areas: balance, endurance, locomotion, strength, transferring, bathing, dressing, toileting and psychosocial support 5. Can the patient actively participate in an intensive therapy program of at least 3 hrs of therapy 5 days a week? Yes 6. The potential for patient to make measurable gains while on inpatient rehab is excellent 7. Anticipated functional outcomes upon discharge from inpatient rehab: supervision and min assist PT, supervision and min assist OT, n/a SLP 8. Estimated rehab length of stay to reach the above functional goals is: 12-16 days. 9. Anticipated discharge destination: Home 10. Overall Rehab/Functional Prognosis: good   MD Signature: Delice Lesch, MD, ABPMR        Revision History

## 2018-10-15 NOTE — Progress Notes (Signed)
Ortho notified of AFO brace

## 2018-10-15 NOTE — Progress Notes (Signed)
Patient ID: Alicia Valdez, female   DOB: 05-Aug-1935, 83 y.o.   MRN: [id number] Patient was admitted to 4M11 via wheelchair, escorted by nursing staff.  Patient verbalized understanding of rehab process including fall prevention policy, personal belongings policy, and visitation policy.  Patient appears to be in no immediate distress at this time.  Patient has incision to lumbar spine closed with skin glue, no drainage.  She also has a rash to her upper and mid back.  Ordered hypoallergenic sheets for the patient but they have not arrived.    Brita Romp, RN

## 2018-10-16 ENCOUNTER — Inpatient Hospital Stay (HOSPITAL_COMMUNITY): Payer: Medicare PPO

## 2018-10-16 ENCOUNTER — Inpatient Hospital Stay (HOSPITAL_COMMUNITY): Payer: Medicare PPO | Admitting: Occupational Therapy

## 2018-10-16 DIAGNOSIS — K9189 Other postprocedural complications and disorders of digestive system: Secondary | ICD-10-CM | POA: Diagnosis present

## 2018-10-16 DIAGNOSIS — K567 Ileus, unspecified: Secondary | ICD-10-CM | POA: Diagnosis present

## 2018-10-16 DIAGNOSIS — G970 Cerebrospinal fluid leak from spinal puncture: Secondary | ICD-10-CM

## 2018-10-16 LAB — CBC WITH DIFFERENTIAL/PLATELET
Abs Immature Granulocytes: 0.03 10*3/uL (ref 0.00–0.07)
Basophils Absolute: 0 10*3/uL (ref 0.0–0.1)
Basophils Relative: 1 %
Eosinophils Absolute: 0.1 10*3/uL (ref 0.0–0.5)
Eosinophils Relative: 1 %
HCT: 41.3 % (ref 36.0–46.0)
Hemoglobin: 14.5 g/dL (ref 12.0–15.0)
Immature Granulocytes: 1 %
Lymphocytes Relative: 9 %
Lymphs Abs: 0.6 10*3/uL — ABNORMAL LOW (ref 0.7–4.0)
MCH: 32 pg (ref 26.0–34.0)
MCHC: 35.1 g/dL (ref 30.0–36.0)
MCV: 91.2 fL (ref 80.0–100.0)
Monocytes Absolute: 0.7 10*3/uL (ref 0.1–1.0)
Monocytes Relative: 11 %
Neutro Abs: 5 10*3/uL (ref 1.7–7.7)
Neutrophils Relative %: 77 %
Platelets: 211 10*3/uL (ref 150–400)
RBC: 4.53 MIL/uL (ref 3.87–5.11)
RDW: 14 % (ref 11.5–15.5)
WBC: 6.4 10*3/uL (ref 4.0–10.5)
nRBC: 0 % (ref 0.0–0.2)

## 2018-10-16 LAB — COMPREHENSIVE METABOLIC PANEL
ALT: 19 U/L (ref 0–44)
AST: 27 U/L (ref 15–41)
Albumin: 3.3 g/dL — ABNORMAL LOW (ref 3.5–5.0)
Alkaline Phosphatase: 61 U/L (ref 38–126)
Anion gap: 12 (ref 5–15)
BUN: 28 mg/dL — ABNORMAL HIGH (ref 8–23)
CO2: 23 mmol/L (ref 22–32)
Calcium: 9.4 mg/dL (ref 8.9–10.3)
Chloride: 99 mmol/L (ref 98–111)
Creatinine, Ser: 1.4 mg/dL — ABNORMAL HIGH (ref 0.44–1.00)
GFR calc Af Amer: 41 mL/min — ABNORMAL LOW (ref >60)
GFR calc non Af Amer: 35 mL/min — ABNORMAL LOW (ref >60)
Glucose, Bld: 121 mg/dL — ABNORMAL HIGH (ref 70–99)
Potassium: 3.9 mmol/L (ref 3.5–5.1)
Sodium: 134 mmol/L — ABNORMAL LOW (ref 135–145)
Total Bilirubin: 0.7 mg/dL (ref 0.3–1.2)
Total Protein: 6.5 g/dL (ref 6.5–8.1)

## 2018-10-16 MED ORDER — SODIUM CHLORIDE 0.9 % IV SOLN
INTRAVENOUS | Status: AC
  Administered 2018-10-16 – 2018-10-17 (×3): via INTRAVENOUS

## 2018-10-16 MED ORDER — PANTOPRAZOLE SODIUM 40 MG PO TBEC
40.0000 mg | DELAYED_RELEASE_TABLET | Freq: Two times a day (BID) | ORAL | Status: DC
  Administered 2018-10-16 – 2018-10-28 (×25): 40 mg via ORAL
  Filled 2018-10-16 (×25): qty 1

## 2018-10-16 MED ORDER — FLEET ENEMA 7-19 GM/118ML RE ENEM
1.0000 | ENEMA | Freq: Once | RECTAL | Status: AC
  Administered 2018-10-16: 19:00:00 1 via RECTAL
  Filled 2018-10-16: qty 1

## 2018-10-16 MED ORDER — MAGNESIUM CITRATE PO SOLN
1.0000 | Freq: Once | ORAL | Status: AC
  Administered 2018-10-16: 16:00:00 1 via ORAL
  Filled 2018-10-16: qty 296

## 2018-10-16 NOTE — Progress Notes (Signed)
[place] PHYSICAL MEDICINE & REHABILITATION PROGRESS NOTE   Subjective/Complaints: Patient reports had an enema yesterday evening- had some results, but said not as much as nursing had hoped.  Still very nauseated, but less feeling like will vomit. Feels a little better than yesterday- nonexistent appetite.     Objective:   Dg Abd 1 View  Result Date: 10/15/2018 CLINICAL DATA:  Constipation EXAM: ABDOMEN - 1 VIEW COMPARISON:  None. FINDINGS: Multiple air-filled loops of bowel are seen throughout the abdomen. Air-fluid levels cannot be evaluated on this supine exam. Stool is seen in the colon. The visible lung bases demonstrate mild left basilar atelectasis. No radio-opaque calculi or other significant radiographic abnormality are seen. Vertebroplasty changes are seen in the T12 vertebral body. IMPRESSION: 1. Multiple air-filled loops of bowel throughout the abdomen are non-specific and may represent ileus or obstruction. Air-fluid levels cannot be evaluated on this supine exam. If there is concern for bowel obstruction, abdominal radiograph with upright or left lateral decubitus views may be helpful. 2. Stool in the colon. Electronically Signed   By: Zerita Boers M.D.   On: 10/15/2018 20:50   Dg Abd 2 Views  Result Date: 10/16/2018 CLINICAL DATA:  Postoperative ileus EXAM: ABDOMEN - 2 VIEW COMPARISON:  10/15/2018 FINDINGS: Large stool burden throughout the colon, similar to prior study. No evidence of bowel obstruction or free air. No organomegaly decreasing gaseous distention of bowel since prior study. IMPRESSION: Decreasing gaseous distention of bowel. Large stool burden throughout the colon. Electronically Signed   By: Rolm Baptise M.D.   On: 10/16/2018 10:54   Recent Labs    10/16/18 0558  WBC 6.4  HGB 14.5  HCT 41.3  PLT 211   Recent Labs    10/16/18 0558  NA 134*  K 3.9  CL 99  CO2 23  GLUCOSE 121*  BUN 28*  CREATININE 1.40*  CALCIUM 9.4    Intake/Output  Summary (Last 24 hours) at 10/16/2018 1138 Last data filed at 10/16/2018 0730 Gross per 24 hour  Intake 240 ml  Output -  Net 240 ml     Physical Exam: Vital Signs Blood pressure (!) 118/58, pulse 87, temperature (!) 97.5 F (36.4 C), temperature source Oral, resp. rate 20, SpO2 91 %.  Physical Exam  Vitals, labs and imaging reviewed. Constitutional: awake, alert, appropriate, lying in bed; PT in room; c/o nausea, no acute distress HENT:  Head: Normocephalic and atraumatic.  Eyes: conjugate gaze  Neck: Normal range of motion.. No tracheal deviation present. No thyromegaly present.  Cardiovascular: Normal rate and regular rhythm.  Respiratory: CTA B/L GI: Soft. Some protuberance vs distension; NT; pretty active BS  Musculoskeletal:        General: No edema.     Comments: LE edema.  Right knee with arthritic changes and well healed incisions.   Neurological: She is alert and oriented to person, place, and time. No cranial nerve deficit.  Motor: Bilateral upper extremities: 5/5 proximal to distal Right lower extremity for hip flexion, knee extension 2-/5, ankle dorsiflexion 4-/5 Left lower extremity: Hip flexion, knee extension 3 -/5, ankle dorsiflexion 4/5  Skin: Skin is warm and dry.  Psychiatric: She has a normal mood and affect. Her behavior is normal.     Assessment/Plan: 1. Functional deficits secondary to Ataxia, balance issues, due to CSF leak s/p reexploration which require 3+ hours per day of interdisciplinary therapy in a comprehensive inpatient rehab setting.  Physiatrist is providing close team supervision and 24 hour management  of active medical problems listed below.  Physiatrist and rehab team continue to assess barriers to discharge/monitor patient progress toward functional and medical goals  Care Tool:  Bathing              Bathing assist       Upper Body Dressing/Undressing Upper body dressing        Upper body assist      Lower Body  Dressing/Undressing Lower body dressing            Lower body assist       Toileting Toileting    Toileting assist Assist for toileting: Minimal Assistance - Patient > 75%     Transfers Chair/bed transfer  Transfers assist     Chair/bed transfer assist level: Minimal Assistance - Patient > 75%     Locomotion Ambulation   Ambulation assist      Assist level: Minimal Assistance - Patient > 75% Assistive device: Walker-rolling     Walk 10 feet activity   Assist           Walk 50 feet activity   Assist           Walk 150 feet activity   Assist           Walk 10 feet on uneven surface  activity   Assist           Wheelchair     Assist               Wheelchair 50 feet with 2 turns activity    Assist            Wheelchair 150 feet activity     Assist          Blood pressure (!) 118/58, pulse 87, temperature (!) 97.5 F (36.4 C), temperature source Oral, resp. rate 20, SpO2 91 %.  Medical Problem List and Plan: 1.  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             Admit to CIR 2.  Antithrombotics: -DVT/anticoagulation:  Mechanical: Sequential compression devices, below knee Bilateral lower extremities              CBC with differential tomorrow a.m.             -antiplatelet therapy: N/A 3. Pain Management: Will d/c Celebrex as likely leading to regurgitation/acid reflux             Monitor with increased activity and upright posture. 4. Mood: LCSW to follow for evaluation and support.              -antipsychotic agents: NA 5. Neuropsych: This patient is capable of making decisions on her own behalf. 6. Skin/Wound Care:  Routine pressure relief measures.  7. Fluids/Electrolytes/Nutrition: Monitor I/O. CMP ordered for tomorrow AM. 8.  Hypokalemia: Needs post op labs-- BMP ordered for tomorrow  a.m.  10/15- K+ 3.9 9.  AKI versus CKD:  Needs post op labs. BMP ordered for tomorrow a.m.  10/15- Cr up to 1.40- BUN 28- slightly /somewhat increased-will give low dose IVFs NS x 24 hours and recheck in AM.  10. Severe GERD: Will schedule Protonix as IV BID. 11. HTN: Monitor BP tid--stable on amlodipine and Avapro/HCTZ.               Monitor with increased mobility.  12. Reactive depression: Feels fine at the moment but has  had a hard time past 6 months. Will resume at lower dose and monitor.             Team to provide support.  13. Constipation: Question ileus--KUB ordered as patient has been unable to eat with severe GERD. Add miralax daily and enema today. Reglan if needed  10/15- F/U KUB after enema shows full of stool, but ileus has improved. Will give Mg citrate and then another enema tonight to clean her out.  Will wait on changing to liquid diet since likely ileus has improved.  LOS: 1 days A FACE TO FACE EVALUATION WAS PERFORMED  Konor Noren 10/16/2018, 11:38 AM

## 2018-10-16 NOTE — Plan of Care (Signed)
  Problem: SCI BOWEL ELIMINATION Goal: RH STG MANAGE BOWEL WITH ASSISTANCE Description: STG Manage Bowel with mod I Assistance. Outcome: Not Progressing; constipation; laxatives; enema on board

## 2018-10-16 NOTE — Evaluation (Signed)
Physical Therapy Assessment and Plan  Patient Details  Name: Alicia Valdez MRN: [id number] Date of Birth: 1935/06/19  PT Diagnosis: Abnormal posture, Abnormality of gait, Coordination disorder, Difficulty walking, Impaired sensation and Muscle weakness Rehab Potential: Good ELOS: 12-14 days   Today's Date: 10/16/2018 PT Individual Time: 1300-1355 and 8:00-9:20 PT Individual Time Calculation (min): 55 min  And 80 min   Problem List:  Patient Active Problem List   Diagnosis Date Noted  . Ileus, postoperative (Alicia Valdez) 10/16/2018  . Myelopathy (Alicia Valdez) 10/15/2018  . Surgery, elective   . AKI (acute kidney injury) ([place])   . Hypokalemia   . Post-operative pain   . Reactive depression   . Drug induced constipation   . Benign essential HTN   . 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 GERD (gastroesophageal reflux disease) 10/29/2016  . Impaired mobility and activities of daily living 10/29/2016  . Iron deficiency anemia due to chronic blood loss 10/29/2016  . Essential hypertension 05/20/2015  . Leg weakness, bilateral 05/19/2015  . Transverse myelitis ([place]) 05/16/2015  . Paraesophageal hernia 11/12/2012  . Microcytic anemia 08/03/2012  . Hyperglycemia 08/03/2012  . C1 cervical fracture ([place]) 08/03/2012  . Atypical chest pain 08/03/2012  . Dysphagia 08/03/2012  . DDD (degenerative disc disease), lumbosacral 01/25/2012    Past Medical History:  Past Medical History:  Diagnosis Date  . Anemia   . GERD (gastroesophageal reflux disease)   . Hypertension   . Transverse myelitis ([place]) 2016   Past Surgical History:  Past Surgical History:  Procedure Laterality Date  . BACK SURGERY    . EPIDURAL BLOOD PATCH    . EYE SURGERY Bilateral    cataract  . FIXATION KYPHOPLASTY    . JOINT REPLACEMENT Right    Knee  . LUMBAR LAMINECTOMY/DECOMPRESSION MICRODISCECTOMY N/A 10/09/2018   Procedure: right lumbar four-five reexploration for cerebrospinal fluid leak;  Surgeon: Alicia Pall, MD;   Location: [facility];  Service: Neurosurgery;  Laterality: N/A;  . PARAESOPHAGEAL HERNIA REPAIR    . UPPER GI ENDOSCOPY      Assessment & Plan Clinical Impression: HPI: Alicia Valdez is an 83 year old female, transverse myelitis with mild residual RLE weakness, prior back surgery, chronic back pain s/p ESI with CSF leak treated with blood patch without termination of leak as well as persistent HA. History taken from chart review and patient. She was admitted on 10/09/2018 for right lumbar spine reexploration for repair of leak by Dr. Christella Valdez on 10/09/18. Post op continues to have RLE>LLE weakness and AFO ordered for right foot. Therapy ongoing and patient limited by ataxia with balance deficit and difficulty with ADLs.  Hospital course further complicated by hypokalemia and?  CKD versus AKI.  CIR recommended due to functional deficits.      Patient transferred to CIR on 10/15/2018 .   Patient currently requires mod assist with mobility secondary to muscle weakness and muscle joint tightness, decreased cardiorespiratoy endurance and decreased sitting balance, decreased standing balance, decreased postural control and decreased balance strategies.  Prior to hospitalization, patient was modified independent  with mobility and lived with Spouse in a House home.  Home access is  Other (comment)multilevel but pt is able to live on main level only.  Has chair lift from garage into home.  Patient will benefit from skilled PT intervention to maximize safe functional mobility, minimize fall risk and decrease caregiver burden for planned discharge home with 24 hour supervision.  Anticipate patient will benefit from follow up [place] at discharge.  PT - End of Session Activity Tolerance: Tolerates 30+ min activity with multiple rests Endurance Deficit: Yes Endurance Deficit Description: Requires mult rest breaks due to nausea w/activity PT Assessment Rehab Potential (ACUTE/IP ONLY): Good PT Barriers to Discharge:  Decreased caregiver support PT Barriers to Discharge Comments: supportive husband but noted he appears elderly and walks w/SPC w/very slow cadence PT Patient demonstrates impairments in the following area(s): Balance;Safety;Sensory;Endurance;Motor;Skin Integrity;Other (comment)(nausea) PT Transfers Functional Problem(s): Bed Mobility;Bed to Chair;Car;Furniture PT Locomotion Functional Problem(s): Ambulation;Wheelchair Mobility;Stairs PT Plan PT Intensity: Minimum of 1-2 x/day ,45 to 90 minutes PT Frequency: 5 out of 7 days PT Duration Estimated Length of Stay: 12-14 days PT Treatment/Interventions: Ambulation/gait training;Neuromuscular re-education;Stair training;UE/LE Strength taining/ROM;Wheelchair propulsion/positioning;Discharge planning;Therapeutic Activities;UE/LE Coordination activities;Functional mobility training;Patient/family education;Splinting/orthotics;Therapeutic Exercise PT Transfers Anticipated Outcome(s): mod I PT Locomotion Anticipated Outcome(s): mod I PT Recommendation Follow Up Recommendations: Home health PT Patient destination: Home Equipment Details: To be further assed  Skilled Therapeutic Intervention Supine therex for LE Strengthening including hip abd/add x 10, heel slides AAROM x 10, isometric gluts x 10 w3 se hold.   Supine to sit on edge of bed w/min assist using rail. See eval for transfer. wc propulsion x 159f performed for eval/repeated for cardiovascular endurance and mobility training.   Pt educated re safe technique w/all transfers including car, bed to/from wc using RW and including proper set up w/wc. Pt educated re wc parts management, importance of locking brakes prior to all transfers and when repositioinng in wc.   Pm session: Pain - denies pain Initiated supine LE therex including hip abd/add when pt requested assistance to BR. Supine to R side to sit w/verbal cues for sequencing and cga, heavy reliance on bedrails. SPT w/RW w/cues for  posture, safe positioning of walker, decreased eccentric control w/stand to sit.  Discussed importance of controlled descent/protection of spine. Gait 142fto commode w/rw w/min assist and verbal cues for proximity to walker, posture. Max assist to lower underwear and pants which are very tight which she states is how she prefers them.   Commode transfer w/mod assist to control descent using grab bars, verbal cues for turning to sit w/walker/walker plcmnt Pt required change of underwear and pants due to urinary incontinence.  Max assist for removal and replacement of these while seated on commode. Pt attempted to perform hygiene in standing w/toilet paper and w/wet washcloths but unable to adequately complete.  Repeated sit to stand x 4 from commode w/mod assist for transition, heavy reliance on grab bars, cues for posture and for transitioning hands from grab bars to RW.  Therapist assisted pt w/hygiene and w/raising underwear and pants. Max assist to raise underwear and pants while standing w/rw due to very snug fit, fatigue.  Gait 1030fommode to wc w/min assist and verbal cues as above.  Reviewed wc parts/brakes/set up for transfers.  Assisted in wc to sink where pt brushed teeth and washed hands wihtout assist.  Bed noted to be wet due to urinary incontinence and sheets changed by therapist to protect skin. wc to bed via SPT w/rw and min assist.  Sit to supine w/mod assist for LE's and cues for technique. Pt left supine w/rails up x 3, alarm set, bed in lowest position, and needs in reach.   PT Evaluation Precautions/Restrictions Precautions Precautions: Back Required Braces or Orthoses: Other Brace Other Brace: AFO Restrictions Weight Bearing Restrictions: NoFalls risk Pain Pain Assessment Pain Score: 0-No paindenies pain in both am and pm sessions Home Living/Prior [place]  Living Available Help at Discharge: Family Type of Home: House Home Access: Other (comment) Home  Layout: Multi-level Alternate Level Stairs-Number of Steps: Flight with chair lift Bathroom Shower/Tub: Walk-in shower(built in seat in the walk-in shower) Bathroom Toilet: Standard Bathroom Accessibility: Yes  Lives With: Spouse Prior Function Level of Independence: Needs assistance with ADLs Dressing: Minimal(to donn her right shoe)  Able to Take Stairs?: Yes Vocation: Retired Leisure: Hobbies-yes (Comment) Comments: Enjoys playing the piano. Started using cane and walker since back pain Vision/Perception wears reading glasses Perception Perception: Within Functional Limits Praxis Praxis: Intact  Cognition Oriented x 4, clear recall, demonstrates good safety awareness  Overall Cognitive Status: Within Functional Limits for tasks assessed Arousal/Alertness: Awake/alert Attention: Sustained Sustained Attention: Impaired(impaired secondary to nausea) Sustained Attention Impairment: Functional basic Memory: Appears intact Immediate Memory Recall: Sock;Blue;Bed Memory Recall Sock: Without Cue Memory Recall Blue: Without Cue Memory Recall Bed: Without Cue Awareness: Appears intact Problem Solving: Appears intact Safety/Judgment: Appears intact Sensation Sensation Light Touch: (sensation intact in BUEs) Hot/Cold: Appears Intact Proprioception: Appears Intact Stereognosis: Appears Intact Additional Comments: Sensation intact in BUEs Coordination Gross Motor Movements are Fluid and Coordinated: No(BUE coordination intact but pt with impaired RLE coordination and strength) Fine Motor Movements are Fluid and Coordinated: Yes Motor  Motor Motor: Other (comment)(BLE weakness with RLE more limited than left)  Mobility Bed Mobility Bed Mobility: Sit to Supine;Supine to Sit Rolling Right: Minimal Assistance - Patient > 75% Rolling Left: Minimal Assistance - Patient > 75% Right Sidelying to Sit: Minimal Assistance - Patient > 75% Supine to Sit: Minimal Assistance - Patient >  75% Sitting - Scoot to Edge of Bed: Contact Guard/Touching assist Sit to Supine: Moderate Assistance - Patient 50-74% Sit to Sidelying Right: Moderate Assistance - Patient 50-74% Transfers Transfers: Sit to Stand;Stand to Sit;Stand Pivot Transfers Sit to Stand: Minimal Assistance - Patient > 75% Stand to Sit: Moderate Assistance - Patient 50-74% Stand Pivot Transfers: Moderate Assistance - Patient 50 - 74% Stand Pivot Transfer Details: Tactile cues for posture;Verbal cues for sequencing Stand Pivot Transfer Details (indicate cue type and reason): maintains approx 25 degrees knee flexion R, no buckling occuring Transfer (Assistive device): Rolling walker Locomotion  Gait Ambulation: Yes Gait Assistance: Minimal Assistance - Patient > 75% Gait Distance (Feet): 30 Feet Assistive device: Rolling walker Gait Assistance Details: Pt maintains flexed posture, knee flexion approx 25degrees but w/o buckling, wc following Gait Gait: Yes Gait Pattern: Impaired Gait velocity: slowed Stairs / Additional Locomotion Stairs: No Wheelchair Mobility Wheelchair Mobility: Yes Wheelchair Assistance: Chartered loss adjuster: Both upper extremities Wheelchair Parts Management: Needs assistance Distance: 126f  Trunk/Postural Assessment  Cervical Assessment Cervical Assessment: Exceptions to WFL(forward head) Thoracic Assessment Thoracic Assessment: Exceptions to WFL(slight thoracic kyphosis) Lumbar Assessment Lumbar Assessment: Exceptions to WFL(posterior pelvic tilt at rest) Postural Control Postural Control: Deficits on evaluation Righting Reactions: pt w/very slowed reactions in standing Postural Limitations: heavily flexed posture in standing, stooped posture sitting  Balance Balance Balance Assessed: Yes Static Sitting Balance Static Sitting - Balance Support: Feet supported Static Sitting - Level of Assistance: 5: Stand by assistance Dynamic Sitting  Balance Dynamic Sitting - Balance Support: During functional activity Dynamic Sitting - Level of Assistance: 5: Stand by assistance Sitting balance - Comments: not able to perform dynamic reaching due to nausea in sitting Static Standing Balance Static Standing - Balance Support: During functional activity Static Standing - Level of Assistance: 4: Min assist Dynamic Standing Balance Dynamic Standing - Balance Support: During functional activity Dynamic  Standing - Level of Assistance: 3: Mod assist Extremity Assessment  RUE Assessment RUE Assessment: Within Functional Limits LUE Assessment LUE Assessment: Within Functional Limits  RLE hip abd 2/5, adduction 2+/5, quads less than 3/5, significant quad lag, hamstrings in sitting less than 4/5, ankle df less than 3/5 w/poor eversion,  LLE Assessment LLE Assessment: Exceptions to [facility] General Strength Comments: hip abd 2+/5, add at least 2+/5, hip flexion 3-/5, quads            hamstrings          df 3+/5, PF at least 2+/5 Quads 4/5 grossly, hamstrings 4/5 in sitting.     Refer to Care Plan for Long Term Goals  Recommendations for other services: None   Discharge Criteria: Patient will be discharged from PT if patient refuses treatment 3 consecutive times without medical reason, if treatment goals not met, if there is a change in medical status, if patient makes no progress towards goals or if patient is discharged from hospital.  The above assessment, treatment plan, treatment alternatives and goals were discussed and mutually agreed upon: by patient  Jerrilyn Cairo 10/16/2018, 4:05 PM

## 2018-10-16 NOTE — Progress Notes (Signed)
Patient information reviewed and entered into [facility] by Becky Darreld Hoffer, PPS coordinator. Information including medical coding, function ability, and quality indicators will be reviewed and updated through discharge.   

## 2018-10-16 NOTE — Evaluation (Signed)
Occupational Therapy Assessment and Plan  Patient Details  Name: Alicia Valdez MRN: [id number] Date of Birth: [birth year]  OT Diagnosis: abnormal posture and muscle weakness (generalized) Rehab Potential: Rehab Potential (ACUTE ONLY): Good ELOS: 12-14 days   Today's Date: 10/16/2018 OT Individual Time: 1420-1500 OT Individual Time Calculation (min): 40 min     Problem List:  Patient Active Problem List   Diagnosis Date Noted  . Ileus, postoperative ([place]) 10/16/2018  . Myelopathy ([place]) 10/15/2018  . Surgery, elective   . AKI (acute kidney injury) ([place])   . Hypokalemia   . Post-operative pain   . Reactive depression   . Drug induced constipation   . Benign essential HTN   . 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 GERD (gastroesophageal reflux disease) 10/29/2016  . Impaired mobility and activities of daily living 10/29/2016  . Iron deficiency anemia due to chronic blood loss 10/29/2016  . Essential hypertension 05/20/2015  . Leg weakness, bilateral 05/19/2015  . Transverse myelitis ([place]) 05/16/2015  . Paraesophageal hernia 11/12/2012  . Microcytic anemia 08/03/2012  . Hyperglycemia 08/03/2012  . C1 cervical fracture ([place]) 08/03/2012  . Atypical chest pain 08/03/2012  . Dysphagia 08/03/2012  . DDD (degenerative disc disease), lumbosacral 01/25/2012    Past Medical History:  Past Medical History:  Diagnosis Date  . Anemia   . GERD (gastroesophageal reflux disease)   . Hypertension   . Transverse myelitis ([place]) 2016   Past Surgical History:  Past Surgical History:  Procedure Laterality Date  . BACK SURGERY    . EPIDURAL BLOOD PATCH    . EYE SURGERY Bilateral    cataract  . FIXATION KYPHOPLASTY    . JOINT REPLACEMENT Right    Knee  . LUMBAR LAMINECTOMY/DECOMPRESSION MICRODISCECTOMY N/A 10/09/2018   Procedure: right lumbar four-five reexploration for cerebrospinal fluid leak;  Surgeon: Ashok Pall, MD;  Location: [facility];  Service: Neurosurgery;  Laterality: N/A;  .  PARAESOPHAGEAL HERNIA REPAIR    . UPPER GI ENDOSCOPY      Assessment & Plan Clinical Impression: Patient is a 83 y.o. year old female with recent admission to the hospital on 10/09/2018 for right lumbar spine reexploration for repair of leak by Dr. Christella Noa on 10/09/18. Post op continues to have RLE>LLE weakness and AFO ordered for right foot.  Patient transferred to CIR on 10/15/2018 .    Patient currently requires mod with basic self-care skills secondary to muscle weakness, impaired timing and sequencing, unbalanced muscle activation and decreased coordination and decreased standing balance, decreased postural control and decreased balance strategies.  Prior to hospitalization, patient could complete ADLs with min.  Patient will benefit from skilled intervention to decrease level of assist with basic self-care skills and increase independence with basic self-care skills prior to discharge home with care partner.  Anticipate patient will require 24 hour supervision and follow up home health.  OT - End of Session Activity Tolerance: Improving Endurance Deficit: Yes Endurance Deficit Description: Requires mult rest breaks due to nausea w/activity OT Assessment Rehab Potential (ACUTE ONLY): Good OT Barriers to Discharge: Decreased caregiver support OT Barriers to Discharge Comments: Spouse cannot provide much physical assist as he needs use of a cane at times. OT Patient demonstrates impairments in the following area(s): Balance;Endurance OT Basic ADL's Functional Problem(s): Grooming;Bathing;Dressing;Toileting OT Advanced ADL's Functional Problem(s): Simple Meal Preparation OT Transfers Functional Problem(s): Tub/Shower;Toilet OT Plan OT Intensity: Minimum of 1-2 x/day, 45 to 90 minutes OT Frequency: 5 out of 7 days OT Duration/Estimated Length of Stay:  12-14 days OT Treatment/Interventions: Journalist, newspaper;Discharge  planning;Functional mobility training;Neuromuscular re-education;Patient/family education;Pain management;Self Care/advanced ADL retraining;UE/LE Coordination activities;Therapeutic Activities;Therapeutic Exercise;UE/LE Strength taining/ROM OT Self Feeding Anticipated Outcome(s): independent OT Basic Self-Care Anticipated Outcome(s): supervision to min assist OT Toileting Anticipated Outcome(s): supervision OT Bathroom Transfers Anticipated Outcome(s): supervision OT Recommendation Patient destination: Home Follow Up Recommendations: Home health OT;24 hour supervision/assistance Equipment Recommended: 3 in 1 bedside comode   Skilled Therapeutic Intervention Pt with increased nausea and not feeling well.  Limited OT session secondary to this with pt missing 30 mins of skilled OT.  She was able to transfer to the EOB with min assist using the rails for support.  She demonstrated decreased ability to reach either lower leg and could not cross them up over the opposite knee secondary to tightness.  Mod assist for initial sit to stand from the EOB was noted with mod assist for ambulation to the bathroom and back with the RW.  Increased right knee flexion with external rotation is noted.  Finished session with pt back in bed to rest secondary to nausea and discomfort.  Call button and phone in reach with safety alarm in place.  Discussed expectations for LOS and supervision goals overall except for LB dressing at min assist secondary to the brace.    OT Evaluation Precautions/Restrictions  Precautions Precautions: Back Required Braces or Orthoses: Other Brace Other Brace: AFO Restrictions Weight Bearing Restrictions: No   Vital Signs Therapy Vitals Pulse Rate: 79 BP: 128/65 Patient Position (if appropriate): Lying Pain Pain Assessment Pain Score: 0-No pain Home Living/Prior Functioning Home Living Family/patient expects to be discharged to:: Private residence Living Arrangements:  Spouse/significant other Available Help at Discharge: Family Type of Home: House Home Access: Other (comment) Home Layout: Multi-level Alternate Level Stairs-Number of Steps: Flight with chair lift Bathroom Shower/Tub: Walk-in shower(built in seat in the walk-in shower) Bathroom Toilet: Standard Bathroom Accessibility: Yes Additional Comments: initial CSF leak October 2019  Lives With: Spouse IADL History Homemaking Responsibilities: Yes Meal Prep Responsibility: Secondary(shares responsibility with spouse) English as a second language teacher: No(spouse completes it) Current License: No Occupation: Retired Prior Function Level of Independence: Needs assistance with ADLs Dressing: Minimal(to donn her right shoe)  Able to Take Stairs?: Yes Driving: No Vocation: Retired Leisure: Hobbies-yes (Comment) Comments: Enjoys playing the piano. Started using cane and walker since back pain ADL ADL Eating: Independent Where Assessed-Eating: Edge of bed Grooming: Supervision/safety Where Assessed-Grooming: Edge of bed Upper Body Bathing: Supervision/safety Where Assessed-Upper Body Bathing: Edge of bed Lower Body Bathing: Moderate assistance Where Assessed-Lower Body Bathing: Edge of bed Upper Body Dressing: Supervision/safety Where Assessed-Upper Body Dressing: Edge of bed Lower Body Dressing: Maximal assistance Where Assessed-Lower Body Dressing: Edge of bed Toileting: Minimal assistance Where Assessed-Toileting: Bedside Commode Toilet Transfer: Moderate assistance Toilet Transfer Method: Counselling psychologist: Raised toilet seat Vision Baseline Vision/History: Wears glasses Wears Glasses: Reading only Patient Visual Report: No change from baseline Vision Assessment?: No apparent visual deficits Perception  Perception: Within Functional Limits Praxis Praxis: Intact Cognition Overall Cognitive Status: Within Functional Limits for tasks assessed Arousal/Alertness:  Awake/alert Orientation Level: Person;Place;Situation Person: Oriented Place: Oriented Situation: Oriented Year: 2020 Month: October Day of Week: Correct Memory: Appears intact Immediate Memory Recall: Sock;Blue;Bed Memory Recall Sock: Without Cue Memory Recall Blue: Without Cue Memory Recall Bed: Without Cue Attention: Sustained Sustained Attention: Impaired(impaired secondary to nausea) Sustained Attention Impairment: Functional basic Awareness: Appears intact Problem Solving: Appears intact Safety/Judgment: Appears intact Sensation Sensation Light Touch: (sensation intact in BUEs) Hot/Cold:  Appears Intact Proprioception: Appears Intact Stereognosis: Appears Intact Additional Comments: Sensation intact in BUEs Coordination Gross Motor Movements are Fluid and Coordinated: No(BUE coordination intact but pt with impaired RLE coordination and strength) Fine Motor Movements are Fluid and Coordinated: Yes Motor  Motor Motor: Other (comment)(BLE weakness with RLE more limited than left) Mobility  Bed Mobility Bed Mobility: Sit to Supine;Supine to Sit Rolling Right: Minimal Assistance - Patient > 75% Rolling Left: Minimal Assistance - Patient > 75% Right Sidelying to Sit: Minimal Assistance - Patient > 75% Supine to Sit: Minimal Assistance - Patient > 75% Sitting - Scoot to Edge of Bed: Contact Guard/Touching assist Sit to Supine: Moderate Assistance - Patient 50-74% Sit to Sidelying Right: Moderate Assistance - Patient 50-74% Transfers Sit to Stand: Minimal Assistance - Patient > 75% Stand to Sit: Moderate Assistance - Patient 50-74%  Trunk/Postural Assessment  Cervical Assessment Cervical Assessment: Exceptions to WFL(forward head) Thoracic Assessment Thoracic Assessment: Exceptions to WFL(slight thoracic kyphosis) Lumbar Assessment Lumbar Assessment: Exceptions to WFL(posterior pelvic tilt at rest) Postural Control Postural Control: Deficits on evaluation Righting  Reactions: pt w/very slowed reactions in standing Postural Limitations: heavily flexed posture in standing, stooped posture sitting  Balance Balance Balance Assessed: Yes Static Sitting Balance Static Sitting - Balance Support: Feet supported Static Sitting - Level of Assistance: 5: Stand by assistance Dynamic Sitting Balance Dynamic Sitting - Balance Support: During functional activity Dynamic Sitting - Level of Assistance: 5: Stand by assistance Sitting balance - Comments: not able to perform dynamic reaching due to nausea in sitting Static Standing Balance Static Standing - Balance Support: During functional activity Static Standing - Level of Assistance: 4: Min assist Dynamic Standing Balance Dynamic Standing - Balance Support: During functional activity Dynamic Standing - Level of Assistance: 3: Mod assist Extremity/Trunk Assessment RUE Assessment RUE Assessment: Within Functional Limits LUE Assessment LUE Assessment: Within Functional Limits     Refer to Care Plan for Long Term Goals  Recommendations for other services: None    Discharge Criteria: Patient will be discharged from OT if patient refuses treatment 3 consecutive times without medical reason, if treatment goals not met, if there is a change in medical status, if patient makes no progress towards goals or if patient is discharged from hospital.  The above assessment, treatment plan, treatment alternatives and goals were discussed and mutually agreed upon: by patient  Su Duma OTR/L 10/16/2018, 4:09 PM

## 2018-10-17 ENCOUNTER — Inpatient Hospital Stay (HOSPITAL_COMMUNITY): Payer: Medicare PPO

## 2018-10-17 ENCOUNTER — Inpatient Hospital Stay (HOSPITAL_COMMUNITY): Payer: Medicare PPO | Admitting: Occupational Therapy

## 2018-10-17 LAB — BASIC METABOLIC PANEL
Anion gap: 10 (ref 5–15)
BUN: 25 mg/dL — ABNORMAL HIGH (ref 8–23)
CO2: 26 mmol/L (ref 22–32)
Calcium: 9 mg/dL (ref 8.9–10.3)
Chloride: 101 mmol/L (ref 98–111)
Creatinine, Ser: 1.03 mg/dL — ABNORMAL HIGH (ref 0.44–1.00)
GFR calc Af Amer: 59 mL/min — ABNORMAL LOW (ref >60)
GFR calc non Af Amer: 51 mL/min — ABNORMAL LOW (ref >60)
Glucose, Bld: 99 mg/dL (ref 70–99)
Potassium: 3.7 mmol/L (ref 3.5–5.1)
Sodium: 137 mmol/L (ref 135–145)

## 2018-10-17 MED ORDER — OXYCODONE HCL 5 MG PO TABS
5.0000 mg | ORAL_TABLET | ORAL | Status: DC | PRN
  Administered 2018-10-17 – 2018-10-23 (×12): 10 mg via ORAL
  Administered 2018-10-24: 5 mg via ORAL
  Administered 2018-10-25 – 2018-10-27 (×5): 10 mg via ORAL
  Filled 2018-10-17 (×3): qty 2
  Filled 2018-10-17: qty 1
  Filled 2018-10-17 (×4): qty 2
  Filled 2018-10-17: qty 1
  Filled 2018-10-17 (×5): qty 2
  Filled 2018-10-17: qty 1
  Filled 2018-10-17 (×4): qty 2

## 2018-10-17 MED ORDER — POLYETHYLENE GLYCOL 3350 17 G PO PACK
17.0000 g | PACK | Freq: Two times a day (BID) | ORAL | Status: DC
  Administered 2018-10-17 – 2018-10-24 (×15): 17 g via ORAL
  Filled 2018-10-17 (×20): qty 1

## 2018-10-17 MED ORDER — FLEET ENEMA 7-19 GM/118ML RE ENEM
1.0000 | ENEMA | Freq: Once | RECTAL | Status: AC
  Administered 2018-10-17: 15:00:00 1 via RECTAL
  Filled 2018-10-17: qty 1

## 2018-10-17 NOTE — Care Management (Signed)
[facility] Individual Statement of Services  Patient Name:  Alicia Valdez  Date:  10/17/2018  Welcome to the [facility].  Our goal is to provide you with an individualized program based on your diagnosis and situation, designed to meet your specific needs.  With this comprehensive rehabilitation program, you will be expected to participate in at least 3 hours of rehabilitation therapies Monday-Friday, with modified therapy programming on the weekends.  Your rehabilitation program will include the following services:  Physical Therapy (PT), Occupational Therapy (OT), 24 hour per day rehabilitation nursing, Therapeutic Recreaction (TR), Case Management (Social Worker), Rehabilitation Medicine, Nutrition Services and Pharmacy Services  Weekly team conferences will be held on Wednesdays to discuss your progress.  Your Social Worker will talk with you frequently to get your input and to update you on team discussions.  Team conferences with you and your family in attendance may also be held.  Expected length of stay: 12-14 days   Overall anticipated outcome: modified independent- supervision  Depending on your progress and recovery, your program may change. Your Social Worker will coordinate services and will keep you informed of any changes. Your Social Worker's name and contact numbers are listed  below.  The following services may also be recommended but are not provided by the [facility] will be made to provide these services after discharge if needed.  Arrangements include referral to agencies that provide these services.  Your insurance has been verified to be:  [employer] Your primary doctor is:  Dr. Magda Paganini A. Tamala Julian Cyndi Bender)  Pertinent information will be shared with your doctor and your insurance  company.  Social Worker:  [place], [place] or [phone]   Information discussed with and copy given to patient by: Lennart Pall, 10/17/2018, 2:37 PM

## 2018-10-17 NOTE — IPOC Note (Signed)
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 Active Problems:   GERD (gastroesophageal reflux disease)   Impaired mobility and activities of daily living   AKI (acute kidney injury) (H[facility])   Drug induced constipation   Myelopathy ([facility])   Ileus, postoperative (H[facility])     Functional Problem List: Nursing Bowel, Endurance, Motor, Pain, Skin Integrity  PT Balance, Safety, Sensory, Endurance, Motor, Skin Integrity, Other (comment)(nausea)  OT Balance, Endurance  SLP    TR         Basic ADL's: OT Grooming, Bathing, Dressing, Toileting     Advanced  ADL's: OT Simple Meal Preparation     Transfers: PT Bed Mobility, Bed to Chair, Car, Lobbyist, Technical brewer: PT Ambulation, Psychologist, prison and probation services, Stairs     Additional Impairments: OT    SLP        TR      Anticipated Outcomes Item Anticipated Outcome  Self Feeding independent  Swallowing      Basic self-care  supervision to min assist  Toileting  supervision   Bathroom Transfers supervision  Bowel/Bladder  Mod I assist  Transfers  mod I  Locomotion  mod I  Communication     Cognition     Pain  < 3  Safety/Judgment  Mod I assist   Therapy Plan: PT Intensity: Minimum of 1-2 x/day ,45 to 90 minutes PT Frequency: 5 out of 7 days PT Duration Estimated Length of Stay: 12-14 days OT Intensity: Minimum of 1-2 x/day, 45 to 90 minutes OT Frequency: 5 out of 7 days OT Duration/Estimated Length of Stay: 12-14 days     Due to the current state of emergency, patients may not be receiving their 3-hours of Medicare-mandated therapy.   Team Interventions: Nursing Interventions Patient/Family Education, Bowel Management, Pain Management, Discharge Planning, Skin Care/Wound Management, Medication Management  PT interventions  Ambulation/gait training, Neuromuscular re-education, Stair training, UE/LE Strength taining/ROM, Wheelchair propulsion/positioning, Discharge planning, Therapeutic Activities, UE/LE Coordination activities, Functional mobility training, Patient/family education, Splinting/orthotics, Therapeutic Exercise  OT Interventions Balance/vestibular training, Community reintegration, DME/adaptive equipment instruction, Discharge planning, Functional mobility training, Neuromuscular re-education, Patient/family education, Pain management, Self Care/advanced ADL retraining, UE/LE Coordination activities, Therapeutic Activities, Therapeutic Exercise, UE/LE Strength taining/ROM  SLP Interventions    TR Interventions    SW/CM Interventions Discharge Planning, Psychosocial Support, Patient/Family Education   Barriers to Discharge MD  Medical stability, Home enviroment access/loayout, Lack of/limited family support and Weight  Nursing      PT Decreased caregiver support supportive husband but noted he appears elderly and walks w/SPC w/very slow cadence  OT Decreased caregiver support Spouse cannot provide much physical assist as he needs use of a cane at times.  SLP      SW       Team Discharge Planning: Destination: PT-Home ,OT- Home , SLP-  Projected Follow-up: PT-Home health PT, OT-  Home health OT, 24 hour supervision/assistance, SLP-  Projected Equipment Needs: PT- , OT- 3 in 1 bedside comode, SLP-  Equipment Details: PT-To be further assed, OT-  Patient/family involved in discharge planning: PT- Patient,  OT-Patient, SLP-   MD ELOS: 12-14 days Medical Rehab Prognosis:  Good Assessment: Pt is an 83 yr old female s/p CSF leak s/p re-exploration of CSF leak with ataxia, balance issues, and weakness- with severe constipation and ileus  that is resolving, AKI that's resolving s/p IVFs,  And Severe reflux/GERD on IV Protonix BID Her goals are supervision to Mod I.   See Team Conference Notes for  weekly updates to the plan of care

## 2018-10-17 NOTE — Progress Notes (Signed)
Physical Therapy Session Note  Patient Details  Name: Alicia Valdez MRN: [id number] Date of Birth: 31-Mar-1935  Today's Date: 10/17/2018 PT Individual Time: 0800-0918 PT Individual Time Calculation (min): 78 min   Short Term Goals: Week 1:  PT Short Term Goal 1 (Week 1): pt will perform bed to/from wc w/supervision w/LRAD PT Short Term Goal 2 (Week 1): Gait 38ft w/RW and min assist PT Short Term Goal 3 (Week 1): Bed mobility mod I w/rails Week 2:    Week 3:     Skilled Therapeutic Interventions/Progress Updates:    PAIN Denies pain Pt initially seated on EOB w/NT.  Therapist assisted pt w/threading feet thru pant legs.  Pt then able to raise pants to thighs, STS w/RW w/cga and completed raising pants w/cga for balance w/rw. Pt requested assist w/upperbody dressing but nursing unable to temp disconnect IV, pt agreed to remain in gown.  SPT bed to wc w/cga.  Assisted pt to sink where she performed brushing teeth, washing face, and applying moisturizer independently.   Pt performed wc propulsion x 132ft mod I on level surface.   Gait 60ft w/RW and cga.  Turn/sit transfer to mat w/rw w/cga and cues for hand placement/sequencing w/RW.  Pt began to c/o feeling "lightheaded" and appeared pale.  Sit to supine w/max assist for lower body.  Pt stated she felt much better in supine and requested to rest.  HR 100.  therex supine including TKE x10 Clamshells x 10 Bridging x 10 Pt stated she "just didn't feet ok and wanted to return to room bc she forgot to use an incontinence pad when dressing this am.  Supine to sit w/min assist.  Mat to wc lateral scoot w/supervision. Pt transported to room due to pt concern re: incontinence SPT wc to bed w/cga Sit to supine w/max assist for LE's In supine pt able to bridge w min assist and lower pants with minimal assist. Rolling l and r w/min assist.  Therapist assisted w/placing inconinence pad.  Pt able to bridge and raise pants w/assist for RLE positioning  only.  W/bed in flat position, pt able to scoot up in bed w/assist to stabilize feet and use of rails.    Supine therex including:  Quad sets x 10, glut sets x 10, RTKE x 20, clamshells x 15, AAROM heelslides RLE x 10, r hip abd/add x 10 Pt left supine w/rails up x 3, alarm set, bed in lowest position, and needs in reach.   Therapy Documentation Precautions:  Precautions Precautions: Back Required Braces or Orthoses: Other Brace Other Brace: AFO Restrictions Weight Bearing Restrictions: No    Therapy/Group: Individual Therapy  Callie Fielding, PT  10/17/2018, 1:03 PM

## 2018-10-17 NOTE — Progress Notes (Signed)
[place] PHYSICAL MEDICINE & REHABILITATION PROGRESS NOTE   Subjective/Complaints: Patient reports getting an appetite back- ate a pancake without forcing it today- BM x2+ yesterday- feels MUCH better.    ROS: denies CP, SOB, Vomiting or diarrhea- still constipated.    Objective:   Dg Abd 1 View  Result Date: 10/15/2018 CLINICAL DATA:  Constipation EXAM: ABDOMEN - 1 VIEW COMPARISON:  None. FINDINGS: Multiple air-filled loops of bowel are seen throughout the abdomen. Air-fluid levels cannot be evaluated on this supine exam. Stool is seen in the colon. The visible lung bases demonstrate mild left basilar atelectasis. No radio-opaque calculi or other significant radiographic abnormality are seen. Vertebroplasty changes are seen in the T12 vertebral body. IMPRESSION: 1. Multiple air-filled loops of bowel throughout the abdomen are non-specific and may represent ileus or obstruction. Air-fluid levels cannot be evaluated on this supine exam. If there is concern for bowel obstruction, abdominal radiograph with upright or left lateral decubitus views may be helpful. 2. Stool in the colon. Electronically Signed   By: Romona Curlsyler  Litton M.D.   On: 10/15/2018 20:50   Dg Abd 2 Views  Result Date: 10/16/2018 CLINICAL DATA:  Postoperative ileus EXAM: ABDOMEN - 2 VIEW COMPARISON:  10/15/2018 FINDINGS: Large stool burden throughout the colon, similar to prior study. No evidence of bowel obstruction or free air. No organomegaly decreasing gaseous distention of bowel since prior study. IMPRESSION: Decreasing gaseous distention of bowel. Large stool burden throughout the colon. Electronically Signed   By: Charlett NoseKevin  Dover M.D.   On: 10/16/2018 10:54   Recent Labs    10/16/18 0558  WBC 6.4  HGB 14.5  HCT 41.3  PLT 211   Recent Labs    10/16/18 0558 10/17/18 0529  NA 134* 137  K 3.9 3.7  CL 99 101  CO2 23 26  GLUCOSE 121* 99  BUN 28* 25*  CREATININE 1.40* 1.03*  CALCIUM 9.4 9.0    Intake/Output  Summary (Last 24 hours) at 10/17/2018 1401 Last data filed at 10/17/2018 1319 Gross per 24 hour  Intake 540 ml  Output -  Net 540 ml     Physical Exam: Vital Signs Blood pressure (!) 122/57, pulse 73, temperature 98 F (36.7 C), temperature source Oral, resp. rate 18, SpO2 97 %.  Physical Exam  Vitals, labs and imaging reviewed. Constitutional: awake, alert, appropriate, sitting up in manual w/c, working with PT- heading out into hallway, brighter and more color, NAD HENT:  Head: Normocephalic and atraumatic.  Eyes: conjugate gaze  Neck: Normal range of motion.. No tracheal deviation present.  Cardiovascular: Normal rate and regular rhythm.  Respiratory: CTA B/L GI: Soft. Less distension; NT, hyperactive BS Musculoskeletal:        General: No edema.     Comments: LE edema.  Right knee with arthritic changes and well healed incisions.   Neurological: She is alert and oriented to person, place, and time. No cranial nerve deficit.  Motor: Bilateral upper extremities: 5/5 proximal to distal Right lower extremity for hip flexion, knee extension 2-/5, ankle dorsiflexion 4-/5 Left lower extremity: Hip flexion, knee extension 3 -/5, ankle dorsiflexion 4/5  Skin: Skin is warm and dry.  Psychiatric: bright affect    Assessment/Plan: 1. Functional deficits secondary to Ataxia, balance issues, due to CSF leak s/p reexploration which require 3+ hours per day of interdisciplinary therapy in a comprehensive inpatient rehab setting.  Physiatrist is providing close team supervision and 24 hour management of active medical problems listed below.  Physiatrist and  rehab team continue to assess barriers to discharge/monitor patient progress toward functional and medical goals  Care Tool:  Bathing    Body parts bathed by patient: Right arm, Left arm, Chest, Abdomen, Front perineal area, Buttocks, Right upper leg, Left upper leg, Face   Body parts bathed by helper: Right lower leg, Left  lower leg     Bathing assist Assist Level: Minimal Assistance - Patient > 75%     Upper Body Dressing/Undressing Upper body dressing   What is the patient wearing?: Hospital gown only    Upper body assist Assist Level: Set up assist    Lower Body Dressing/Undressing Lower body dressing      What is the patient wearing?: Underwear/pull up, Pants     Lower body assist Assist for lower body dressing: Minimal Assistance - Patient > 75%     Toileting Toileting    Toileting assist Assist for toileting: Minimal Assistance - Patient > 75%     Transfers Chair/bed transfer  Transfers assist     Chair/bed transfer assist level: Contact Guard/Touching assist     Locomotion Ambulation   Ambulation assist      Assist level: Contact Guard/Touching assist Assistive device: Walker-rolling Max distance: 50   Walk 10 feet activity   Assist     Assist level: Contact Guard/Touching assist Assistive device: Walker-rolling   Walk 50 feet activity   Assist Walk 50 feet with 2 turns activity did not occur: Safety/medical concerns  Assist level: Contact Guard/Touching assist Assistive device: Walker-rolling    Walk 150 feet activity   Assist Walk 150 feet activity did not occur: Safety/medical concerns         Walk 10 feet on uneven surface  activity   Assist Walk 10 feet on uneven surfaces activity did not occur: Safety/medical concerns         Wheelchair     Assist Will patient use wheelchair at discharge?: No             Wheelchair 50 feet with 2 turns activity    Assist            Wheelchair 150 feet activity     Assist          Blood pressure (!) 122/57, pulse 73, temperature 98 F (36.7 C), temperature source Oral, resp. rate 18, SpO2 97 %.  Medical Problem List and Plan: 1.  Ataxia, balance deficits, lower extremity weakness, limitation in self-care secondary to lumbar spine reexploration with repair of CSF  leak.             Admit to CIR 2.  Antithrombotics: -DVT/anticoagulation:  Mechanical: Sequential compression devices, below knee Bilateral lower extremities              CBC with differential tomorrow a.m.             -antiplatelet therapy: N/A 3. Pain Management: Will d/c Celebrex as likely leading to regurgitation/acid reflux             Monitor with increased activity and upright posture. 4. Mood: LCSW to follow for evaluation and support.              -antipsychotic agents: NA 5. Neuropsych: This patient is capable of making decisions on her own behalf. 6. Skin/Wound Care:  Routine pressure relief measures.  7. Fluids/Electrolytes/Nutrition: Monitor I/O. CMP ordered for tomorrow AM. 8.  Hypokalemia: Needs post op labs-- BMP ordered for tomorrow a.m.  10/15- K+ 3.9 9.  AKI versus CKD:  Needs post op labs. BMP ordered for tomorrow a.m.  10/15- Cr up to 1.40- BUN 28- slightly /somewhat increased-will give low dose IVFs NS x 24 hours and recheck in AM.   10/16- Cr back down to 1.03 and BUN 25- stop IVFs after 24 hrs. 10. Severe GERD: Will schedule Protonix as IV BID. 11. HTN: Monitor BP tid--stable on amlodipine and Avapro/HCTZ.               Monitor with increased mobility.  12. Reactive depression: Feels fine at the moment but has had a hard time past 6 months. Will resume at lower dose and monitor.             Team to provide support.  13. Constipation: Question ileus--KUB ordered as patient has been unable to eat with severe GERD. Add miralax daily and enema today. Reglan if needed  10/15- F/U KUB after enema shows full of stool, but ileus has improved. Will give Mg citrate and then another enema tonight to clean her out.  Will wait on changing to liquid diet since likely ileus has improved.  10/16- vastly improved- resolved- went x2+ yesterday. Feels "back to normal".   LOS: 2 days A FACE TO FACE EVALUATION WAS PERFORMED  Claus Silvestro 10/17/2018, 2:01 PM

## 2018-10-17 NOTE — Progress Notes (Signed)
Occupational Therapy Session Note  Patient Details  Name: Alicia Valdez MRN: [id number] Date of Birth: 1935-10-10  Today's Date: 10/17/2018 OT Individual Time: 0945-1100 OT Individual Time Calculation (min): 75 min    Short Term Goals: Week 1:  OT Short Term Goal 1 (Week 1): Pt will complete LB bathing with min assist sit to stand using AE PRN. OT Short Term Goal 2 (Week 1): Pt will complete walk-in shower transfers with min assist using the RW and shower seat/bench. OT Short Term Goal 3 (Week 1): Pt will donn her underpants and pants with min assist sit to stand. OT Short Term Goal 4 (Week 1): Pt will complete toilet transfers and toileting with no more than min assist for 2 consecutive sessions.  Skilled Therapeutic Interventions/Progress Updates:    Pt received in bed agreeable to therapy.  She was able to sit to EOB with min A and then rise to stand with CGA.  Pt ambulated to bathroom with CGA with RW to toilet with close S. She did not have a BM, just urinated which she could cleanse. Pt reported she uses a toilet aid and bidet at home to help with cleansing after a BM.   Pt sat in wc to bathe.  At home she uses a long handled sponge to reach feet. She needs min A here without the AE.  Donned pants with CGA.  Used sock aid for socks.  Needs help from husband at home for shoes.  She has tried shoe horn and may benefit from shoe funnel and elastic laces. She will likely use an AFO so may need to wait to see what kind of laces to use with that.  Pt opted to lay in bed.  Mod A to bring legs back into bed.  Pt in bed with all needs met.   Therapy Documentation Precautions:  Precautions Precautions: Back Required Braces or Orthoses: Other Brace Other Brace: AFO Restrictions Weight Bearing Restrictions: No    Vital Signs: Therapy Vitals Temp: 98 F (36.7 C) Temp Source: Oral Pulse Rate: 73 Resp: 18 BP: (!) 122/57 Patient Position (if appropriate): Lying Oxygen Therapy SpO2:  97 % O2 Device: Room Air Pain: Pain Assessment Pain Score: 5  Pain Type: Acute pain Pain Location: Back Pain Orientation: Lower Pain Descriptors / Indicators: Burning Pain Onset: On-going ADL: ADL Eating: Independent Where Assessed-Eating: Edge of bed Grooming: Supervision/safety Where Assessed-Grooming: Edge of bed Upper Body Bathing: Supervision/safety Where Assessed-Upper Body Bathing: Edge of bed Lower Body Bathing: Moderate assistance Where Assessed-Lower Body Bathing: Edge of bed Upper Body Dressing: Supervision/safety Where Assessed-Upper Body Dressing: Edge of bed Lower Body Dressing: Maximal assistance Where Assessed-Lower Body Dressing: Edge of bed Toileting: Minimal assistance Where Assessed-Toileting: Bedside Commode Toilet Transfer: Moderate assistance Toilet Transfer Method: Insurance claims handler Equipment: Raised toilet seat   Therapy/Group: Individual Therapy  [place] 10/17/2018, 12:46 PM

## 2018-10-17 NOTE — Progress Notes (Signed)
Occupational Therapy Session Note  Patient Details  Name: Alicia Valdez MRN: [id number] Date of Birth: [birth year]  Today's Date: 10/17/2018 OT Individual Time: 4098-1191 OT Individual Time Calculation (min): 29 min  and Today's Date: 10/17/2018 OT Missed Time: 31 Minutes Missed Time Reason: Other (comment)   Short Term Goals: Week 1:  OT Short Term Goal 1 (Week 1): Pt will complete LB bathing with min assist sit to stand using AE PRN. OT Short Term Goal 2 (Week 1): Pt will complete walk-in shower transfers with min assist using the RW and shower seat/bench. OT Short Term Goal 3 (Week 1): Pt will donn her underpants and pants with min assist sit to stand. OT Short Term Goal 4 (Week 1): Pt will complete toilet transfers and toileting with no more than min assist for 2 consecutive sessions.  Skilled Therapeutic Interventions/Progress Updates:    Treatment session with focus on LB dressing with various AE. Pt received upright in recliner reporting not feeling as well as she had this AM.  Pt also reporting distracted as she is worried about her husband.  Pt agreeable to attempt donning shoes with alternative methods to decrease burden of care on her husband.  Pt able to place shoe funnel in shoe with increased time, but unable to don shoe independently, still required assistance for setup of shoe.  Pt able to don socks with sock aid with increased time.  Discussed shoe buttons for alternative method for fastening shoes.  Pt distracted throughout as her son was reaching out to her in regards to her husband.  Pt requested to terminate session early as she was distracted and needing to locate her husband.  Notified RN of patient concerns.  Therapy Documentation Precautions:  Precautions Precautions: Back Required Braces or Orthoses: Other Brace Other Brace: AFO Restrictions Weight Bearing Restrictions: No Vital Signs: Therapy Vitals Temp: 98 F (36.7 C) Temp Source: Oral Pulse Rate:  73 Resp: 18 BP: (!) 122/57 Patient Position (if appropriate): Lying Oxygen Therapy SpO2: 97 % O2 Device: Room Air Pain:  Pt with no c/o pain   Therapy/Group: Individual Therapy  Simonne Come 10/17/2018, 2:44 PM

## 2018-10-17 NOTE — Progress Notes (Signed)
Social Work Assessment and Plan   Patient Details  Name: Alicia Valdez MRN: [id number] Date of Birth: 1935/05/19  Today's Date: 10/17/2018  Problem List:  Patient Active Problem List   Diagnosis Date Noted  . Ileus, postoperative ([place]) 10/16/2018  . Myelopathy ([place]) 10/15/2018  . Surgery, elective   . AKI (acute kidney injury) ([place])   . Hypokalemia   . Post-operative pain   . Reactive depression   . Drug induced constipation   . Benign essential HTN   . 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 GERD (gastroesophageal reflux disease) 10/29/2016  . Impaired mobility and activities of daily living 10/29/2016  . Iron deficiency anemia due to chronic blood loss 10/29/2016  . Essential hypertension 05/20/2015  . Leg weakness, bilateral 05/19/2015  . Transverse myelitis ([place]) 05/16/2015  . Paraesophageal hernia 11/12/2012  . Microcytic anemia 08/03/2012  . Hyperglycemia 08/03/2012  . C1 cervical fracture ([place]) 08/03/2012  . Atypical chest pain 08/03/2012  . Dysphagia 08/03/2012  . DDD (degenerative disc disease), lumbosacral 01/25/2012   Past Medical History:  Past Medical History:  Diagnosis Date  . Anemia   . GERD (gastroesophageal reflux disease)   . Hypertension   . Transverse myelitis ([place]) 2016   Past Surgical History:  Past Surgical History:  Procedure Laterality Date  . BACK SURGERY    . EPIDURAL BLOOD PATCH    . EYE SURGERY Bilateral    cataract  . FIXATION KYPHOPLASTY    . JOINT REPLACEMENT Right    Knee  . LUMBAR LAMINECTOMY/DECOMPRESSION MICRODISCECTOMY N/A 10/09/2018   Procedure: right lumbar four-five reexploration for cerebrospinal fluid leak;  Surgeon: Ashok Pall, MD;  Location: [facility];  Service: Neurosurgery;  Laterality: N/A;  . PARAESOPHAGEAL HERNIA REPAIR    . UPPER GI ENDOSCOPY     Social History:  reports that she has never smoked. She has never used smokeless tobacco. She reports that she does not use drugs. No history on file for alcohol.  Family  / Support Systems Marital Status: Married Patient Roles: Spouse, Parent Spouse/Significant Other: spouse, Alicia Valdez @ (H) [phone] or (C) [phone] Children: one adult son living in [place] Anticipated Caregiver: 63 year old spouse Ability/Limitations of Caregiver: no limitations Caregiver Availability: 24/7 Family Dynamics: Pt describes spouse and son as very supportive and denies any concerns about assistance available.  Social History Preferred language: English Religion:  Cultural Background: NA Read: Yes Write: Yes Employment Status: Retired Public relations account executive Issues: None Guardian/Conservator: None - per MD, pt is capable of making decisions on her own behalf.   Abuse/Neglect Abuse/Neglect Assessment Can Be Completed: Yes Physical Abuse: Denies Verbal Abuse: Denies Sexual Abuse: Denies Exploitation of patient/patient's resources: Denies Self-Neglect: Denies  Emotional Status Pt's affect, behavior and adjustment status: Pt sitting up in w/c and just finishing therapy.  Very pleasant and completes assessment interview without any difficulty.  She is eager for therapy program and to regain her independence. She denies any significant emotional distress - will monitor. Recent Psychosocial Issues: two prior procedures to control CSF leak that were unsuccessful. Psychiatric History: None Substance Abuse History: None  Patient / Family Perceptions, Expectations & Goals Pt/Family understanding of illness & functional limitations: Pt and spouse with good understanding of procedures done here and current functional limitations/ need for CIR. Premorbid pt/family roles/activities: Pt was independent overall but does use walker for mobility. Anticipated changes in roles/activities/participation: Little change anticipated as spouse was assisting her PTA Pt/family expectations/goals: "I just hope this works and  I'm good to go this time."  [employer]: None Premorbid Home Care/DME Agencies: None Transportation available at discharge: yes  Discharge Planning Living Arrangements: Spouse/significant other Support Systems: Spouse/significant other, Children Type of Residence: Private residence Insurance Resources: Electrical engineer Resources: Restaurant manager, fast food Screen Referred: No Living Expenses: Own Money Management: Patient, Spouse Does the patient have any problems obtaining your medications?: No Home Management: pt and spouse Patient/Family Preliminary Plans: Pt and spouse may plan to stay with son in [place] for a little bit after d/c vs going directly home.  Clinical Impression Very pleasant woman on CIR following procedure to address chronic CSF leak.  Pt without any concerns and notes spouse and family are very supportive.  Hopeful this latest procedure will "finally fix things" and plans to return home with spouse as primary caregiver.  Will follow for support and discharge planning needs.  Alicia Valdez 10/17/2018, 3:47 PM

## 2018-10-18 ENCOUNTER — Inpatient Hospital Stay (HOSPITAL_COMMUNITY): Payer: Medicare PPO | Admitting: Physical Therapy

## 2018-10-18 ENCOUNTER — Inpatient Hospital Stay (HOSPITAL_COMMUNITY): Payer: Medicare PPO | Admitting: Occupational Therapy

## 2018-10-18 DIAGNOSIS — K567 Ileus, unspecified: Secondary | ICD-10-CM

## 2018-10-18 DIAGNOSIS — I10 Essential (primary) hypertension: Secondary | ICD-10-CM

## 2018-10-18 DIAGNOSIS — K5903 Drug induced constipation: Secondary | ICD-10-CM

## 2018-10-18 DIAGNOSIS — K9189 Other postprocedural complications and disorders of digestive system: Secondary | ICD-10-CM

## 2018-10-18 DIAGNOSIS — N179 Acute kidney failure, unspecified: Secondary | ICD-10-CM

## 2018-10-18 DIAGNOSIS — E876 Hypokalemia: Secondary | ICD-10-CM

## 2018-10-18 DIAGNOSIS — G479 Sleep disorder, unspecified: Secondary | ICD-10-CM

## 2018-10-18 MED ORDER — METOCLOPRAMIDE HCL 5 MG PO TABS
10.0000 mg | ORAL_TABLET | Freq: Four times a day (QID) | ORAL | Status: DC
  Administered 2018-10-18 – 2018-10-21 (×14): 10 mg via ORAL
  Filled 2018-10-18 (×14): qty 2

## 2018-10-18 NOTE — Progress Notes (Signed)
Occupational Therapy Session Note  Patient Details  Name: Breely Panik MRN: [id number] Date of Birth: 05-03-1935  Today's Date: 10/18/2018 OT Individual Time: 0175-1025 OT Individual Time Calculation (min): 45 min  and Today's Date: 10/18/2018 OT Missed Time: 15 Minutes Missed Time Reason: Patient ill (comment)(headache)   Short Term Goals: Week 1:  OT Short Term Goal 1 (Week 1): Pt will complete LB bathing with min assist sit to stand using AE PRN. OT Short Term Goal 2 (Week 1): Pt will complete walk-in shower transfers with min assist using the RW and shower seat/bench. OT Short Term Goal 3 (Week 1): Pt will donn her underpants and pants with min assist sit to stand. OT Short Term Goal 4 (Week 1): Pt will complete toilet transfers and toileting with no more than min assist for 2 consecutive sessions.  Skilled Therapeutic Interventions/Progress Updates:    Upon entering the room, pt seated in wheelchair at sink performing grooming tasks. Pt reports having washed self prior to OT arrival. Pt donning UB clothing items with min A for compression tank top. Pt needing assistance to thread pants over B feet and discussed use of reacher next session. Pt reports having one at home as well. Pt standing with min A to pull pants over B hips at sink. OT rearranged furniture in room to increase ease with transfer into recliner chair. Pt transferred with min A and use of RW into recliner chair. Pt closing eyes and reporting 10/10 headache pain. RN notified. Room darkened and pt requesting to remain up in recliner chair. Chair alarm activated and call bell within reach. RN arriving with medications as OT exited the room to allow pt to rest.   Therapy Documentation Precautions:  Precautions Precautions: Back Required Braces or Orthoses: Other Brace Other Brace: AFO Restrictions Weight Bearing Restrictions: No General: General OT Amount of Missed Time: 15 Minutes Vital Signs:   Pain: Pain  Assessment Pain Scale: 0-10 Pain Score: 8  Pain Type: Acute pain Pain Location: Head Pain Descriptors / Indicators: Aching Pain Frequency: Intermittent Pain Onset: On-going Pain Intervention(s): Medication (See eMAR) ADL: ADL Eating: Independent Where Assessed-Eating: Edge of bed Grooming: Supervision/safety Where Assessed-Grooming: Edge of bed Upper Body Bathing: Supervision/safety Where Assessed-Upper Body Bathing: Edge of bed Lower Body Bathing: Moderate assistance Where Assessed-Lower Body Bathing: Edge of bed Upper Body Dressing: Supervision/safety Where Assessed-Upper Body Dressing: Edge of bed Lower Body Dressing: Maximal assistance Where Assessed-Lower Body Dressing: Edge of bed Toileting: Minimal assistance Where Assessed-Toileting: Bedside Commode Toilet Transfer: Moderate assistance Toilet Transfer Method: Insurance claims handler Equipment: Raised toilet seat   Therapy/Group: Individual Therapy  Gypsy Decant 10/18/2018, 10:40 AM

## 2018-10-18 NOTE — Plan of Care (Signed)
  Problem: SCI BOWEL ELIMINATION Goal: RH STG MANAGE BOWEL WITH ASSISTANCE Description: STG Manage Bowel with mod I Assistance. 10/18/2018 1601 by Ander Slade, RN Outcome: Not Progressing; LBM 10/16 c/o constipation

## 2018-10-18 NOTE — Progress Notes (Signed)
[facility] PHYSICAL MEDICINE & REHABILITATION PROGRESS NOTE   Subjective/Complaints: Patient seen sitting up in her chair this morning.  She states she slept well overnight after taking a sleep aid, but notes it is too strong and she still feels very lethargic.  ROS: Denies CP, SOB, Vomiting or diarrhea- still constipated.    Objective:   Dg Abd 2 Views  Result Date: 10/16/2018 CLINICAL DATA:  Postoperative ileus EXAM: ABDOMEN - 2 VIEW COMPARISON:  10/15/2018 FINDINGS: Large stool burden throughout the colon, similar to prior study. No evidence of bowel obstruction or free air. No organomegaly decreasing gaseous distention of bowel since prior study. IMPRESSION: Decreasing gaseous distention of bowel. Large stool burden throughout the colon. Electronically Signed   By: Charlett Nose M.D.   On: 10/16/2018 10:54   Recent Labs    10/16/18 0558  WBC 6.4  HGB 14.5  HCT 41.3  PLT 211   Recent Labs    10/16/18 0558 10/17/18 0529  NA 134* 137  K 3.9 3.7  CL 99 101  CO2 23 26  GLUCOSE 121* 99  BUN 28* 25*  CREATININE 1.40* 1.03*  CALCIUM 9.4 9.0    Intake/Output Summary (Last 24 hours) at 10/18/2018 1027 Last data filed at 10/17/2018 1821 Gross per 24 hour  Intake 240 ml  Output -  Net 240 ml     Physical Exam: Vital Signs Blood pressure 123/67, pulse 66, temperature 97.6 F (36.4 C), temperature source Oral, resp. rate 16, SpO2 96 %. Constitutional: No distress . Vital signs reviewed. HENT: Normocephalic.  Atraumatic. Eyes: EOMI. No discharge. Cardiovascular: No JVD. Respiratory: Normal effort.  No stridor. GI: Non-distended. Skin: Warm and dry.  Intact. Psych: Normal mood.  Normal behavior. Musc: Lower extremity edema Neurological: Easily arousable, but lethargic Motor: Bilateral upper extremities: 5/5 proximal to distal, unchanged Right lower extremity for hip flexion, knee extension 2-/5, ankle dorsiflexion 4-/5, unchanged Left lower extremity: Hip flexion,  knee extension 3 -/5, ankle dorsiflexion 4/5 , unchanged  Assessment/Plan: 1. Functional deficits secondary to Ataxia, balance issues, due to CSF leak s/p reexploration which require 3+ hours per day of interdisciplinary therapy in a comprehensive inpatient rehab setting.  Physiatrist is providing close team supervision and 24 hour management of active medical problems listed below.  Physiatrist and rehab team continue to assess barriers to discharge/monitor patient progress toward functional and medical goals  Care Tool:  Bathing    Body parts bathed by patient: Right arm, Left arm, Chest, Abdomen, Front perineal area, Buttocks, Right upper leg, Left upper leg, Face   Body parts bathed by helper: Right lower leg, Left lower leg     Bathing assist Assist Level: Minimal Assistance - Patient > 75%     Upper Body Dressing/Undressing Upper body dressing   What is the patient wearing?: Hospital gown only    Upper body assist Assist Level: Set up assist    Lower Body Dressing/Undressing Lower body dressing      What is the patient wearing?: Underwear/pull up, Pants     Lower body assist Assist for lower body dressing: Minimal Assistance - Patient > 75%     Toileting Toileting    Toileting assist Assist for toileting: Minimal Assistance - Patient > 75%     Transfers Chair/bed transfer  Transfers assist     Chair/bed transfer assist level: Contact Guard/Touching assist     Locomotion Ambulation   Ambulation assist      Assist level: Contact Guard/Touching assist Assistive device:  Walker-rolling Max distance: 50   Walk 10 feet activity   Assist     Assist level: Contact Guard/Touching assist Assistive device: Walker-rolling   Walk 50 feet activity   Assist Walk 50 feet with 2 turns activity did not occur: Safety/medical concerns  Assist level: Contact Guard/Touching assist Assistive device: Walker-rolling    Walk 150 feet activity   Assist  Walk 150 feet activity did not occur: Safety/medical concerns         Walk 10 feet on uneven surface  activity   Assist Walk 10 feet on uneven surfaces activity did not occur: Safety/medical concerns         Wheelchair     Assist Will patient use wheelchair at discharge?: No             Wheelchair 50 feet with 2 turns activity    Assist            Wheelchair 150 feet activity     Assist          Blood pressure 123/67, pulse 66, temperature 97.6 F (36.4 C), temperature source Oral, resp. rate 16, SpO2 96 %.  Medical Problem List and Plan: 1.  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  Continue CIR 2.  Antithrombotics: -DVT/anticoagulation:  Mechanical: Sequential compression devices, below knee Bilateral lower extremities              CBC within normal limits on 10/15             -antiplatelet therapy: N/A 3. Pain Management: DC'd Celebrex as likely leading to regurgitation/acid reflux             Monitor with increased activity and upright posture. 4. Mood: LCSW to follow for evaluation and support.              -antipsychotic agents: NA 5. Neuropsych: This patient is capable of making decisions on her own behalf. 6. Skin/Wound Care:  Routine pressure relief measures.  7. Fluids/Electrolytes/Nutrition: Monitor I/O. Marland Kitchen 8.  Hypokalemia:   Potassium 3.7 on 10/16, labs ordered for Monday 9.  AKI versus CKD:    Creatinine 1.03 on 10/16, labs ordered for tomorrow  10. Severe GERD: Will schedule Protonix as IV BID. 11. HTN: Monitor BP tid--stable on amlodipine and Avapro/HCTZ.    Controlled on 10/17             Monitor with increased mobility.  12. Reactive depression: Feels fine at the moment but has had a hard time past 6 months.  Resumed at lower dose and monitor.             Team to provide support.  13. Constipation with ileus:   Added miralax  daily and enema today.   Plan to decrease Reglan tomorrow if symptoms continue to improve  Continue to adjust bowel meds as necessary 14.  Sleep disturbance  Trazodone DC'd  Patient already receiving Compazine and Benadryl   LOS: 3 days A FACE TO FACE EVALUATION WAS PERFORMED   Lorie Phenix 10/18/2018, 10:27 AM

## 2018-10-18 NOTE — Progress Notes (Signed)
Physical Therapy Session Note  Patient Details  Name: Alicia Valdez MRN: [id number] Date of Birth: March 31, 1935  Today's Date: 10/18/2018 PT Individual Time: 1410-1505 PT Individual Time Calculation (min): 55 min   Short Term Goals: Week 1:  PT Short Term Goal 1 (Week 1): pt will perform bed to/from wc w/supervision w/LRAD PT Short Term Goal 2 (Week 1): Gait 69f w/RW and min assist PT Short Term Goal 3 (Week 1): Bed mobility mod I w/rails  Skilled Therapeutic Interventions/Progress Updates:   Pt received supine in bed and agreeable to PT. Supine>sit transfer with supervision assist and min cues for safety and back precautions.  Stand pivot and sit<>stand transfers throughout treatment with CGA for safety and min cues to minimize trunk rotation.   WC mobility x 1049fand 15085fith supervision assist and min cues intermittent to improve use fo RUE to to prevent veer into wall on the R.   Gait training with RW over level surface x 140f86fth CGA . Dynamic gait training with RW to weave through 6 cones x 2 with CGA for safety. Side stepping in parallel bars 4 x 10ft52f with level 1 tband on knee for bouts 2 and 3. Forward/reverse gait with BUE support 4 x10ft 10f.  Throughout gait training  Min cues for decreased step length on the L and improved activation of R side glutes and quads to improve safety and normalized gait pattern.   Patient returned to room and left [facility] reach and all needs met.   .          Therapy Documentation Precautions:  Precautions Precautions: Back Required Braces or Orthoses: Other Brace Other Brace: AFO Restrictions Weight Bearing Restrictions: No    Vital Signs: Therapy Vitals Temp: 97.8 F (36.6 C) Temp Source: Oral Pulse Rate: 81 Resp: 17 BP: 133/71 Patient Position (if appropriate): Lying Oxygen Therapy SpO2: 94 % O2 Device: Room Air Pain: Pain Assessment Pain Score: 0-No pain    Therapy/Group: Individual  Therapy  AustinLorie Phenix/2020, 3:12 PM

## 2018-10-19 ENCOUNTER — Inpatient Hospital Stay (HOSPITAL_COMMUNITY): Payer: Medicare PPO | Admitting: Occupational Therapy

## 2018-10-19 ENCOUNTER — Inpatient Hospital Stay (HOSPITAL_COMMUNITY): Payer: Medicare PPO

## 2018-10-19 NOTE — Progress Notes (Signed)
Occupational Therapy Session Note  Patient Details  Name: Cambreigh Dearing MRN: [id number] Date of Birth: 11-27-1935  Today's Date: 10/19/2018 OT Individual Time: 7001-7494 OT Individual Time Calculation (min): 45 min    Short Term Goals: Week 1:  OT Short Term Goal 1 (Week 1): Pt will complete LB bathing with min assist sit to stand using AE PRN. OT Short Term Goal 2 (Week 1): Pt will complete walk-in shower transfers with min assist using the RW and shower seat/bench. OT Short Term Goal 3 (Week 1): Pt will donn her underpants and pants with min assist sit to stand. OT Short Term Goal 4 (Week 1): Pt will complete toilet transfers and toileting with no more than min assist for 2 consecutive sessions.  Skilled Therapeutic Interventions/Progress Updates:    1:1 Pt in recliner when arrived. Pt offered to shower but declined today and reports she already donned clean underwear and wash periarea/buttocks. Completed bathing and dressing at the sink with setup and extra time. Pt able to doff socks with reacher as well as thread pants with reacher. Pt able to stand and pull up pants with steady A. Pt was donning socks with sockaide and can continue doing this currently. Pt was able to cross her legs (with use of pant leg to assist her LE up) to don her shoes (bilaterally). Still requires A to tie; but met her goal to don them today!.   Ambulated to the RN station from outside her room with RW with contact guard; turned and returned back to her room. Pt with more flexion in bilateral leans with return back to room with increased shuffling due to fatigue.   In recliner went through homework sheet for exercises in the recliner including glut/quad squeezes, ROM in ankles and ROM with UEs.\\   2nd session 10:00-11:00 no c/o pain Pt received in recliner. Pt ambulated to the bathroom with RW with min A and performed toileting with contact guard. Pt reports being tired from her nap. Transported to the ADL  apartment and practiced bed mobility entering on the right side with instructional cues for sequencing to not use a rail. Due to increased bed height pt did required A to put both LEs in the bed.  In the gym education and practice on getting in and out of shower stall. Encourage use of RW (no grab bars in the shower). Shower is large and able to take walker and transfer to built in seat. Pt able to demonstrate with simulated shower stall transfer stepping in with RW and out forwards with RW.   Performed a portion of the OTAG in sitting and standing. Pt with difficulty with terminal knee extension with prolonged standing.   Also continued standing tolerance and balance on Airodex foam while performing table top activity. Pt able to stand for a portion of the task due to LE fatigue.  Therapy Documentation Precautions:  Precautions Precautions: Back Required Braces or Orthoses: Other Brace Other Brace: AFO Restrictions Weight Bearing Restrictions: No General:   Vital Signs:   Pain:     Therapy/Group: Individual Therapy  [facility] 10/19/2018, 12:02 PM

## 2018-10-19 NOTE — Progress Notes (Signed)
[facility] PHYSICAL MEDICINE & REHABILITATION PROGRESS NOTE   Subjective/Complaints: Patient seen sitting up in a chair this morning.  She states she slept well overnight and does not feel lethargic this morning.  She enjoyed her day of rest yesterday.  She is tolerating advanced diet.  ROS: Denies CP, SOB, nausea, vomiting, diarrhea.   Objective:   No results found. No results for input(s): WBC, HGB, HCT, PLT in the last 72 hours. Recent Labs    10/17/18 0529  NA 137  K 3.7  CL 101  CO2 26  GLUCOSE 99  BUN 25*  CREATININE 1.03*  CALCIUM 9.0    Intake/Output Summary (Last 24 hours) at 10/19/2018 0835 Last data filed at 10/19/2018 0728 Gross per 24 hour  Intake 360 ml  Output -  Net 360 ml     Physical Exam: Vital Signs Blood pressure 139/70, pulse 76, temperature 98 F (36.7 C), temperature source Oral, resp. rate 16, SpO2 96 %. Constitutional: No distress . Vital signs reviewed. HENT: Normocephalic.  Atraumatic. Eyes: EOMI. No discharge. Cardiovascular: No JVD. Respiratory: Normal effort.  No stridor. GI: Non-distended. Skin: Warm and dry.  Intact. Psych: Normal mood.  Normal behavior. Musc: Lower extremity edema Neurological: Alert Motor:  Right lower extremity for hip flexion, knee extension 2-/5, ankle dorsiflexion 4-/5, improving Left lower extremity: Hip flexion, knee extension 3/5, ankle dorsiflexion 4/5  Assessment/Plan: 1. Functional deficits secondary to Ataxia, balance issues, due to CSF leak s/p reexploration which require 3+ hours per day of interdisciplinary therapy in a comprehensive inpatient rehab setting.  Physiatrist is providing close team supervision and 24 hour management of active medical problems listed below.  Physiatrist and rehab team continue to assess barriers to discharge/monitor patient progress toward functional and medical goals  Care Tool:  Bathing    Body parts bathed by patient: Right arm, Left arm, Chest, Abdomen,  Front perineal area, Buttocks, Right upper leg, Left upper leg, Face   Body parts bathed by helper: Right lower leg, Left lower leg     Bathing assist Assist Level: Minimal Assistance - Patient > 75%     Upper Body Dressing/Undressing Upper body dressing   What is the patient wearing?: Pull over shirt    Upper body assist Assist Level: Minimal Assistance - Patient > 75%    Lower Body Dressing/Undressing Lower body dressing      What is the patient wearing?: Underwear/pull up, Pants     Lower body assist Assist for lower body dressing: Minimal Assistance - Patient > 75%     Toileting Toileting    Toileting assist Assist for toileting: Minimal Assistance - Patient > 75%     Transfers Chair/bed transfer  Transfers assist     Chair/bed transfer assist level: Contact Guard/Touching assist     Locomotion Ambulation   Ambulation assist      Assist level: Contact Guard/Touching assist Assistive device: Walker-rolling Max distance: 130   Walk 10 feet activity   Assist     Assist level: Contact Guard/Touching assist Assistive device: Walker-rolling   Walk 50 feet activity   Assist Walk 50 feet with 2 turns activity did not occur: Safety/medical concerns  Assist level: Contact Guard/Touching assist Assistive device: Walker-rolling    Walk 150 feet activity   Assist Walk 150 feet activity did not occur: Safety/medical concerns         Walk 10 feet on uneven surface  activity   Assist Walk 10 feet on uneven surfaces activity did not  occur: Safety/medical concerns         Wheelchair     Assist Will patient use wheelchair at discharge?: No      Wheelchair assist level: Supervision/Verbal cueing Max wheelchair distance: 150    Wheelchair 50 feet with 2 turns activity    Assist        Assist Level: Supervision/Verbal cueing   Wheelchair 150 feet activity     Assist      Assist Level: Supervision/Verbal cueing    Blood pressure 139/70, pulse 76, temperature 98 F (36.7 C), temperature source Oral, resp. rate 16, SpO2 96 %.  Medical Problem List and Plan: 1.  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  Continue CIR 2.  Antithrombotics: -DVT/anticoagulation:  Mechanical: Sequential compression devices, below knee Bilateral lower extremities              CBC within normal limits on 10/15, labs ordered for tomorrow             -antiplatelet therapy: N/A 3. Pain Management: DC'd Celebrex as likely leading to regurgitation/acid reflux             Monitor with increased activity and upright posture. 4. Mood: LCSW to follow for evaluation and support.              -antipsychotic agents: NA 5. Neuropsych: This patient is capable of making decisions on her own behalf. 6. Skin/Wound Care:  Routine pressure relief measures.  7. Fluids/Electrolytes/Nutrition: Monitor I/O. Marland Kitchen 8.  Hypokalemia:   Potassium 3.7 on 10/16, labs ordered for tomorrow 9.  AKI versus CKD:    Creatinine 1.03 on 10/16, labs ordered for tomorrow 10. Severe GERD: Will schedule Protonix as IV BID. 11. HTN: Monitor BP tid--stable on amlodipine and Avapro/HCTZ.    Relatively controlled on 10/18             Monitor with increased mobility.  12. Reactive depression: Feels fine at the moment but has had a hard time past 6 months.  Resumed at lower dose and monitor.             Team to provide support.  13. Constipation with ileus:   Added miralax daily and enema  Diet advanced to soft diet, will further advance if tolerated  Continue Reglan for now  Continue to adjust bowel meds as necessary 14.  Sleep disturbance  Trazodone DC'd  Patient already receiving Compazine and Benadryl  Improving  LOS: 4 days A FACE TO FACE EVALUATION WAS PERFORMED  Charie Pinkus Karis Juba 10/19/2018, 8:35 AM

## 2018-10-19 NOTE — Progress Notes (Signed)
Dr. Posey Pronto notified of hypotensive incident with therapy today. Patient is feeling better no complaints of dizziness BP 110/57. No new orders noted. Continued to monitor.

## 2018-10-19 NOTE — Progress Notes (Signed)
Physical Therapy Session Note  Patient Details  Name: Alicia Valdez MRN: [id number] Date of Birth: 09-26-1935  Today's Date: 10/19/2018 PT Individual Time: 3474-2595 PT Individual Time Calculation (min): 58 min   Short Term Goals: Week 1:  PT Short Term Goal 1 (Week 1): pt will perform bed to/from wc w/supervision w/LRAD PT Short Term Goal 2 (Week 1): Gait 59ft w/RW and min assist PT Short Term Goal 3 (Week 1): Bed mobility mod I w/rails  Skilled Therapeutic Interventions/Progress Updates:    Pt seated in recliner upon PT arrival, agreeable to therapy tx and denies pain. Pt performed stand pivot to w/c with min assist and transported to the dayroom. Pt performed stand pivot this session from w/c<>nustep with min assist. PT reports that she has a nustep at home that she uses for exercises. Pt worked on Ship broker and endurance this session while using nustep on workload 5, x 10 minutes. Pt ambulated x 80 ft this session with RW and min assist, noted to maintain hip and knee flexion throughout with poor foot clearance. Pt performed 2 x 10 sit<>stands from mat without UE support working on LE strengthening, facilitation and cues for increased hip extension in standing. Pt worked on dynamic standing balance this session without UE support while standing on airex to toss horse shoes and then standing on level ground performing overhead reaching activity. Pt performed seated LAQ x 10 per LE for strengthening. Therapist performed B LE hamstring stretch this session for ROM and flexibility. Pt reports suddenly feeling lighthead and transferred to supine with supervision. Therapist checked BP in hooklying- 120/60. Pt reported feeling a little better and transferred to sitting with supervision, BP in sitting- 88/59 with pt symptomatic. Pt transported back to room and transferred to bed with min assist, left supine with LEs elevated and RN made aware of orthostatic hypotension during this session.     Therapy Documentation Precautions:  Precautions Precautions: Back Required Braces or Orthoses: Other Brace Other Brace: AFO Restrictions Weight Bearing Restrictions: No    Therapy/Group: Individual Therapy  Netta Corrigan, PT, DPT, CSRS 10/19/2018, 1:15 PM

## 2018-10-19 NOTE — Plan of Care (Signed)
  Problem: SCI BOWEL ELIMINATION Goal: RH STG MANAGE BOWEL WITH ASSISTANCE Description: STG Manage Bowel with mod I Assistance. Outcome: Not Progressing; constipation

## 2018-10-20 ENCOUNTER — Inpatient Hospital Stay (HOSPITAL_COMMUNITY): Payer: Medicare PPO | Admitting: Occupational Therapy

## 2018-10-20 ENCOUNTER — Inpatient Hospital Stay (HOSPITAL_COMMUNITY): Payer: Medicare PPO | Admitting: Physical Therapy

## 2018-10-20 LAB — BASIC METABOLIC PANEL
Anion gap: 12 (ref 5–15)
BUN: 26 mg/dL — ABNORMAL HIGH (ref 8–23)
CO2: 29 mmol/L (ref 22–32)
Calcium: 9.5 mg/dL (ref 8.9–10.3)
Chloride: 96 mmol/L — ABNORMAL LOW (ref 98–111)
Creatinine, Ser: 1.36 mg/dL — ABNORMAL HIGH (ref 0.44–1.00)
GFR calc Af Amer: 42 mL/min — ABNORMAL LOW (ref >60)
GFR calc non Af Amer: 37 mL/min — ABNORMAL LOW (ref >60)
Glucose, Bld: 112 mg/dL — ABNORMAL HIGH (ref 70–99)
Potassium: 3.4 mmol/L — ABNORMAL LOW (ref 3.5–5.1)
Sodium: 137 mmol/L (ref 135–145)

## 2018-10-20 LAB — CBC
HCT: 39.9 % (ref 36.0–46.0)
Hemoglobin: 13.4 g/dL (ref 12.0–15.0)
MCH: 30.7 pg (ref 26.0–34.0)
MCHC: 33.6 g/dL (ref 30.0–36.0)
MCV: 91.5 fL (ref 80.0–100.0)
Platelets: 255 10*3/uL (ref 150–400)
RBC: 4.36 MIL/uL (ref 3.87–5.11)
RDW: 13.9 % (ref 11.5–15.5)
WBC: 4.9 10*3/uL (ref 4.0–10.5)
nRBC: 0 % (ref 0.0–0.2)

## 2018-10-20 MED ORDER — MAGNESIUM CITRATE PO SOLN
1.0000 | Freq: Once | ORAL | Status: AC
  Administered 2018-10-20: 14:00:00 1 via ORAL
  Filled 2018-10-20: qty 296

## 2018-10-20 MED ORDER — POTASSIUM CHLORIDE CRYS ER 20 MEQ PO TBCR
40.0000 meq | EXTENDED_RELEASE_TABLET | Freq: Once | ORAL | Status: AC
  Administered 2018-10-20: 40 meq via ORAL
  Filled 2018-10-20: qty 2

## 2018-10-20 MED ORDER — POTASSIUM CHLORIDE CRYS ER 20 MEQ PO TBCR
20.0000 meq | EXTENDED_RELEASE_TABLET | Freq: Every day | ORAL | Status: DC
  Administered 2018-10-21 – 2018-10-28 (×8): 20 meq via ORAL
  Filled 2018-10-20 (×8): qty 1

## 2018-10-20 NOTE — Progress Notes (Signed)
Occupational Therapy Session Note  Patient Details  Name: Alicia Valdez MRN: [id number] Date of Birth: 12/21/1935  Today's Date: 10/20/2018 OT Individual Time: 1115-1200 OT Individual Time Calculation (min): 45 min    Short Term Goals: Week 1:  OT Short Term Goal 1 (Week 1): Pt will complete LB bathing with min assist sit to stand using AE PRN. OT Short Term Goal 2 (Week 1): Pt will complete walk-in shower transfers with min assist using the RW and shower seat/bench. OT Short Term Goal 3 (Week 1): Pt will donn her underpants and pants with min assist sit to stand. OT Short Term Goal 4 (Week 1): Pt will complete toilet transfers and toileting with no more than min assist for 2 consecutive sessions.  Skilled Therapeutic Interventions/Progress Updates:    Treatment session with focus on dynamic standing balance and upright posture during standing and functional mobility.  Pt received upright in w/c agreeable to therapy session. Engaged in functional mobility around obstacles with initial min assist for standing posture and during ambulation due to posterior pelvic tilt in standing and ambulation.  Pt managed walking about obstacles with RW with min assist.  Engaged in dynamic standing activity at high-low table with focus on upright standing posture.  Pt required intermittent tactile cues for upright posture and weight shifting to obtain full upright posture.  Discussed functional carryover of dynamic standing activity.  Ambulated 53' and 41' with RW with CGA in straight trajectory and with increased posture and weight shift.  Pt returned to sitting in recliner in room and left with all needs in reach.  Therapy Documentation Precautions:  Precautions Precautions: Back Required Braces or Orthoses: Other Brace Other Brace: AFO Restrictions Weight Bearing Restrictions: No Pain:  Pt with no c/o pain   Therapy/Group: Individual Therapy  Simonne Come 10/20/2018, 12:20 PM

## 2018-10-20 NOTE — Progress Notes (Signed)
Physical Therapy Session Note  Patient Details  Name: Alicia Valdez MRN: [id number] Date of Birth: 04/03/1935  Today's Date: 10/20/2018 PT Individual Time: 1300-1400 PT Individual Time Calculation (min): 60 min   Short Term Goals: Week 1:  PT Short Term Goal 1 (Week 1): pt will perform bed to/from wc w/supervision w/LRAD PT Short Term Goal 2 (Week 1): Gait 7ft w/RW and min assist PT Short Term Goal 3 (Week 1): Bed mobility mod I w/rails  Skilled Therapeutic Interventions/Progress Updates:    Pt received seated in recliner in room, agreeable to PT session. No complaints of pain. Sit to stand with min A to RW. Ambulation to bathroom with RW and min A. Toilet transfer with min A, independent for clothing management and pericare. Pt is Supervision for standing balance at sink to wash hands, v/c for safety. Ambulation 2 x 80 ft with RW and min A, antalgic gait pattern with decreased BLE clearance during gait. Pt has R AFO in room but reports it cuts into her ankle during gait. Contacted [employer] for AFO adjustment. Nustep level 3 x 10 min with use of B UE/LE for global endurance training. Per pt report she used Nustep at home 1-2 times per day x 15 min prior to onset of recent weakness. Sit to stand x 5 reps with no AD and min A to focus on LE strengthening and body positioning during transfer. Pt reports onset of dizziness and reports this happens frequently since admit to hospital followed by decreased in BP. Seated BP 124/58, SpO2 96%, HR 80. Pt requests to lay down to resolve symptoms. Sit to supine mod A for BLE management. Pt left supine in bed with needs in reach at end of session. Pt missed 15 min of scheduled skilled therapy services due to onset of dizziness.  Therapy Documentation Precautions:  Precautions Precautions: Back Required Braces or Orthoses: Other Brace Other Brace: AFO Restrictions Weight Bearing Restrictions: No General: PT Amount of Missed Time (min): 15 Minutes PT  Missed Treatment Reason: Patient ill (Comment)(reports dizziness, vitals WNL)    Therapy/Group: Individual Therapy   Excell Seltzer, PT, DPT  10/20/2018, 2:02 PM

## 2018-10-20 NOTE — Progress Notes (Addendum)
[place] PHYSICAL MEDICINE & REHABILITATION PROGRESS NOTE   Subjective/Complaints:   Pt reports no decent BM (only 1-3 pellets) still- Abd feels distended and firm to her as well.  Sounds like she might not have done Mg citrate on Friday- did do enema, but not soap suds.WIll order.  ROS: Denies CP, SOB, nausea, vomiting, diarrhea.   Objective:   No results found. Recent Labs    10/20/18 0856  WBC 4.9  HGB 13.4  HCT 39.9  PLT 255   Recent Labs    10/20/18 0856  NA 137  K 3.4*  CL 96*  CO2 29  GLUCOSE 112*  BUN 26*  CREATININE 1.36*  CALCIUM 9.5    Intake/Output Summary (Last 24 hours) at 10/20/2018 1040 Last data filed at 10/20/2018 0700 Gross per 24 hour  Intake 100 ml  Output -  Net 100 ml     Physical Exam: Vital Signs Blood pressure 122/70, pulse 74, temperature 98 F (36.7 C), temperature source Oral, resp. rate 16, SpO2 94 %. Constitutional: No distress . Vital signs reviewed. Sitting up in bed; appropriate, NAD HENT: Normocephalic.  Atraumatic. Eyes: EOMI. No discharge. Cardiovascular: No JVD. Respiratory: Normal effort.  No stridor. GI: distended, firm not hard, NT, hypoactive BS Skin: Warm and dry.  Intact. Psych:slightly nervous Musc: Lower extremity edema Neurological: Alert Motor:  Right lower extremity for hip flexion, knee extension 2-/5, ankle dorsiflexion 4-/5, improving Left lower extremity: Hip flexion, knee extension 3/5, ankle dorsiflexion 4/5  Assessment/Plan: 1. Functional deficits secondary to Ataxia, balance issues, due to CSF leak s/p reexploration which require 3+ hours per day of interdisciplinary therapy in a comprehensive inpatient rehab setting.  Physiatrist is providing close team supervision and 24 hour management of active medical problems listed below.  Physiatrist and rehab team continue to assess barriers to discharge/monitor patient progress toward functional and medical goals  Care Tool:  Bathing     Body parts bathed by patient: Right arm, Left arm, Chest, Abdomen, Front perineal area, Buttocks, Right upper leg, Left upper leg, Face   Body parts bathed by helper: Right lower leg, Left lower leg     Bathing assist Assist Level: Minimal Assistance - Patient > 75%     Upper Body Dressing/Undressing Upper body dressing   What is the patient wearing?: Pull over shirt    Upper body assist Assist Level: Supervision/Verbal cueing    Lower Body Dressing/Undressing Lower body dressing      What is the patient wearing?: Underwear/pull up, Pants     Lower body assist Assist for lower body dressing: Minimal Assistance - Patient > 75%     Toileting Toileting    Toileting assist Assist for toileting: Minimal Assistance - Patient > 75%     Transfers Chair/bed transfer  Transfers assist     Chair/bed transfer assist level: Minimal Assistance - Patient > 75%     Locomotion Ambulation   Ambulation assist      Assist level: Minimal Assistance - Patient > 75% Assistive device: Walker-rolling Max distance: 80 ft   Walk 10 feet activity   Assist     Assist level: Minimal Assistance - Patient > 75% Assistive device: Walker-rolling   Walk 50 feet activity   Assist Walk 50 feet with 2 turns activity did not occur: Safety/medical concerns  Assist level: Minimal Assistance - Patient > 75% Assistive device: Walker-rolling    Walk 150 feet activity   Assist Walk 150 feet activity did not occur: Safety/medical concerns  Walk 10 feet on uneven surface  activity   Assist Walk 10 feet on uneven surfaces activity did not occur: Safety/medical concerns         Wheelchair     Assist Will patient use wheelchair at discharge?: No      Wheelchair assist level: Supervision/Verbal cueing Max wheelchair distance: 150    Wheelchair 50 feet with 2 turns activity    Assist        Assist Level: Supervision/Verbal cueing   Wheelchair 150  feet activity     Assist      Assist Level: Supervision/Verbal cueing   Blood pressure 122/70, pulse 74, temperature 98 F (36.7 C), temperature source Oral, resp. rate 16, SpO2 94 %.  Medical Problem List and Plan: 1.  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  Continue CIR 2.  Antithrombotics: -DVT/anticoagulation:  Mechanical: Sequential compression devices, below knee Bilateral lower extremities              CBC within normal limits on 10/15, labs ordered for tomorrow  10/19- want to see if can start Lovenox/SQ Heparin- will ask Pam to call [facility] to see if can start meds..              -antiplatelet therapy: N/A 3. Pain Management: DC'd Celebrex as likely leading to regurgitation/acid reflux             Monitor with increased activity and upright posture. 4. Mood: LCSW to follow for evaluation and support.              -antipsychotic agents: NA 5. Neuropsych: This patient is capable of making decisions on her own behalf. 6. Skin/Wound Care:  Routine pressure relief measures.  7. Fluids/Electrolytes/Nutrition: Monitor I/O. Marland Kitchen 8.  Hypokalemia:   Potassium 3.7 on 10/16, labs ordered for tomorrow  10/19- K+ 3.4- will replete and recheck Wed. 9.  AKI versus CKD:    Creatinine 1.03 on 10/16, labs ordered for tomorrow  10/19- Cr 1.36- not drinking as well- will encourage fluids, recheck Wed. 10. Severe GERD: Will schedule Protonix as IV BID. 11. HTN: Monitor BP tid--stable on amlodipine and Avapro/HCTZ.    Relatively controlled on 10/18             Monitor with increased mobility.  12. Reactive depression: Feels fine at the moment but has had a hard time past 6 months.  Resumed at lower dose and monitor.             Team to provide support.  13. Constipation with ileus:   Added miralax daily and enema  Diet advanced to soft diet, will further advance if tolerated  Continue Reglan  for now  10/19- will give another Mg citrate and soap suds enema- stomach distended/more firm.  Continue to adjust bowel meds as necessary 14.  Sleep disturbance  Trazodone DC'd  Patient already receiving Compazine and Benadryl  Improving  LOS: 5 days A FACE TO FACE EVALUATION WAS PERFORMED  Escher Harr 10/20/2018, 10:40 AM

## 2018-10-20 NOTE — Progress Notes (Signed)
Occupational Therapy Session Note  Patient Details  Name: Alicia Valdez MRN: [id number] Date of Birth: 1935/10/18  Today's Date: 10/20/2018 OT Individual Time: 6283-1517 and 1030-1100 OT Individual Time Calculation (min): 55 min and 30 min   Short Term Goals: Week 1:  OT Short Term Goal 1 (Week 1): Pt will complete LB bathing with min assist sit to stand using AE PRN. OT Short Term Goal 2 (Week 1): Pt will complete walk-in shower transfers with min assist using the RW and shower seat/bench. OT Short Term Goal 3 (Week 1): Pt will donn her underpants and pants with min assist sit to stand. OT Short Term Goal 4 (Week 1): Pt will complete toilet transfers and toileting with no more than min assist for 2 consecutive sessions.  Skilled Therapeutic Interventions/Progress Updates:    Session One: Pt seen for OT ADL session. Pt sitting up in w/c upon arrival gathering clothing items in prep for ADLs, denied pain. She completed UB bathing/dressing routine from w/c level at sink with set-up/supervision. Throughout w/c mobility as well as sit<>Stand, pt very impulsive, not locking w/c brakes and little awareness to safety risks during tasks, requiring VCs throughout.  She used reacher to thread on underwear, stood with min-mod A with posterior bias requiring mod A for standing balance and VCs to achieve midline stand. She voiced dizziness upon extended stand. See BP readings below. Transitioned to recliner with +2 for safety due to fall risk.  Pt left sitting up resting in recliner with feet elevated, will attempt to make-up time as pt able.  BP sitting in w/c: 103/56 BP following stand (unable to tolerate standing for full BP reading): 99/60  Sitting in w/c with TED: 113/54 Standing with TEDs: 103/60  TED hose donned and provided with water. Education provided regarding importance of hydration and use of TED hose.  RN made aware of events of session.    Session Two: Therapist returned to pt's  room, pt voiced feeling much better following time to rest and agreeable to tx session. She denied pain and requested toileting task. She ambulated into bathroom with RW min A. REquired VCs for proper use of RW in functional context.  Toileting task completed with steadying assist. Returned out to room and completed hand hygiene standing at sink with steadying assist.  Retrieved new w/c with different seating length to promote OOB tolerance. Pt voiced significantly increased comfort with new w/c. She self propelled w/c throughout unit with supervision for UE strengthening and generalized activity tolerance.  Pt requesting to stay in dayroom to await next therapy session in 15 minutes. Reviewed need to stay in w/c during this time, pt voiced understanding.  No complaints of dizziness/lightheadedness this session.   Therapy Documentation Precautions:  Precautions Precautions: Back Required Braces or Orthoses: Other Brace Other Brace: AFO Restrictions Weight Bearing Restrictions: No   Therapy/Group: Individual Therapy  [name] L 10/20/2018, 6:59 AM

## 2018-10-21 ENCOUNTER — Inpatient Hospital Stay (HOSPITAL_COMMUNITY): Payer: Medicare PPO | Admitting: Occupational Therapy

## 2018-10-21 ENCOUNTER — Inpatient Hospital Stay (HOSPITAL_COMMUNITY): Payer: Medicare PPO | Admitting: Physical Therapy

## 2018-10-21 MED ORDER — SORBITOL 70 % SOLN
60.0000 mL | Status: AC
  Administered 2018-10-21: 60 mL via ORAL
  Filled 2018-10-21: qty 60

## 2018-10-21 MED ORDER — METOCLOPRAMIDE HCL 5 MG PO TABS
10.0000 mg | ORAL_TABLET | Freq: Three times a day (TID) | ORAL | Status: DC
  Administered 2018-10-21 – 2018-10-26 (×19): 10 mg via ORAL
  Filled 2018-10-21 (×21): qty 2

## 2018-10-21 NOTE — Plan of Care (Signed)
  Problem: Consults Goal: RH SPINAL CORD INJURY PATIENT EDUCATION Description:  See Patient Education module for education specifics.  Outcome: Progressing Goal: Skin Care Protocol Initiated - if Braden Score 18 or less Description: If consults are not indicated, leave blank or document N/A Outcome: Progressing   Problem: SCI BOWEL ELIMINATION Goal: RH STG MANAGE BOWEL WITH ASSISTANCE Description: STG Manage Bowel with mod I Assistance. Outcome: Progressing Goal: RH STG SCI MANAGE BOWEL WITH MEDICATION WITH ASSISTANCE Description: STG SCI Manage bowel with medication with mod I assistance. Outcome: Progressing Goal: RH STG SCI MANAGE BOWEL PROGRAM W/ASSIST OR AS APPROPRIATE Description: STG SCI Manage bowel program w/ mod I assist or as appropriate. Outcome: Progressing   Problem: SCI BLADDER ELIMINATION Goal: RH STG SCI MANAGE BLADDER PROGRAM W/ASSISTANCE Description: Patient will verbalize understanding of bladder program including PVR checks and intermittent caths as needed to empty bladder. Outcome: Progressing   Problem: RH SKIN INTEGRITY Goal: RH STG SKIN FREE OF INFECTION/BREAKDOWN Description: Patients skin will remain free from further infection or breakdown with mod I assist. Outcome: Progressing Goal: RH STG MAINTAIN SKIN INTEGRITY WITH ASSISTANCE Description: STG Maintain Skin Integrity With mod I Assistance. Outcome: Progressing   Problem: RH PAIN MANAGEMENT Goal: RH STG PAIN MANAGED AT OR BELOW PT'S PAIN GOAL Description: < 3 Outcome: Progressing   

## 2018-10-21 NOTE — Progress Notes (Signed)
Pt requested for the enema to be administered after today's therapy. Denies any tenderness or pain to the abdomen. Will report off to the on-coming nurse

## 2018-10-21 NOTE — Progress Notes (Addendum)
[place] PHYSICAL MEDICINE & REHABILITATION PROGRESS NOTE   Subjective/Complaints:   Still no bm despite mag citrate yesterday. Never got SSE. Having some gas. Denies belly pain. Therapy progressing  ROS: Patient denies fever, rash, sore throat, blurred vision, nausea, vomiting, diarrhea, cough, shortness of breath or chest pain, joint or back pain, headache, or mood change.    Objective:   No results found. Recent Labs    10/20/18 0856  WBC 4.9  HGB 13.4  HCT 39.9  PLT 255   Recent Labs    10/20/18 0856  NA 137  K 3.4*  CL 96*  CO2 29  GLUCOSE 112*  BUN 26*  CREATININE 1.36*  CALCIUM 9.5    Intake/Output Summary (Last 24 hours) at 10/21/2018 0921 Last data filed at 10/21/2018 0838 Gross per 24 hour  Intake 560 ml  Output -  Net 560 ml     Physical Exam: Vital Signs Blood pressure 125/65, pulse 85, temperature 98.1 F (36.7 C), temperature source Oral, resp. rate 17, SpO2 96 %. Constitutional: No distress . Vital signs reviewed. HEENT: EOMI, oral membranes moist Neck: supple Cardiovascular: RRR without murmur. No JVD    Respiratory: CTA Bilaterally without wheezes or rales. Normal effort    GI: BS + but hypoactive, mild distention. Non-tender Skin: Warm and dry.  Intact. Psych: very pleasant Musc: Lower extremity edema trace Neurological: Alert Motor:  Right lower extremity for hip flexion, knee extension 2/5, ankle dorsiflexion 4-/5, improving Left lower extremity: Hip flexion, knee extension 3/5, ankle dorsiflexion 4/5  Assessment/Plan: 1. Functional deficits secondary to Ataxia, balance issues, due to CSF leak s/p reexploration which require 3+ hours per day of interdisciplinary therapy in a comprehensive inpatient rehab setting.  Physiatrist is providing close team supervision and 24 hour management of active medical problems listed below.  Physiatrist and rehab team continue to assess barriers to discharge/monitor patient progress toward  functional and medical goals  Care Tool:  Bathing    Body parts bathed by patient: Right arm, Left arm, Chest, Abdomen, Front perineal area, Buttocks, Right upper leg, Left upper leg, Face   Body parts bathed by helper: Right lower leg, Left lower leg     Bathing assist Assist Level: Minimal Assistance - Patient > 75%     Upper Body Dressing/Undressing Upper body dressing   What is the patient wearing?: Pull over shirt    Upper body assist Assist Level: Supervision/Verbal cueing    Lower Body Dressing/Undressing Lower body dressing      What is the patient wearing?: Underwear/pull up, Pants     Lower body assist Assist for lower body dressing: Minimal Assistance - Patient > 75%     Toileting Toileting    Toileting assist Assist for toileting: Minimal Assistance - Patient > 75%     Transfers Chair/bed transfer  Transfers assist     Chair/bed transfer assist level: Minimal Assistance - Patient > 75%     Locomotion Ambulation   Ambulation assist      Assist level: Minimal Assistance - Patient > 75% Assistive device: Walker-rolling Max distance: 80 ft   Walk 10 feet activity   Assist     Assist level: Minimal Assistance - Patient > 75% Assistive device: Walker-rolling   Walk 50 feet activity   Assist Walk 50 feet with 2 turns activity did not occur: Safety/medical concerns  Assist level: Minimal Assistance - Patient > 75% Assistive device: Walker-rolling    Walk 150 feet activity   Assist Walk  150 feet activity did not occur: Safety/medical concerns         Walk 10 feet on uneven surface  activity   Assist Walk 10 feet on uneven surfaces activity did not occur: Safety/medical concerns         Wheelchair     Assist Will patient use wheelchair at discharge?: No      Wheelchair assist level: Supervision/Verbal cueing Max wheelchair distance: 150    Wheelchair 50 feet with 2 turns activity    Assist         Assist Level: Supervision/Verbal cueing   Wheelchair 150 feet activity     Assist      Assist Level: Supervision/Verbal cueing   Blood pressure 125/65, pulse 85, temperature 98.1 F (36.7 C), temperature source Oral, resp. rate 17, SpO2 96 %.  Medical Problem List and Plan: 1.  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  Continue CIR therapies 2.  Antithrombotics: -DVT/anticoagulation:  Mechanical: Sequential compression devices, below knee Bilateral lower extremities              CBC within normal limits on 10/15    10/20--pt ambulating 100'+ today with PT. Moves both legs well. Probably don't need to pursue lovenox or heparin at this point             -antiplatelet therapy: N/A 3. Pain Management: DC'd Celebrex as likely leading to regurgitation/acid reflux             Monitor with increased activity and upright posture. 4. Mood: LCSW to follow for evaluation and support.              -antipsychotic agents: NA 5. Neuropsych: This patient is capable of making decisions on her own behalf. 6. Skin/Wound Care:  Routine pressure relief measures.  7. Fluids/Electrolytes/Nutrition: Monitor I/O. Marland Kitchen 8.  Hypokalemia:   Potassium 3.7 on 10/16  10/19- K+ 3.4- will repleting and recheck Wed. 9.  AKI versus CKD:    Creatinine 1.03 on 10/16   10/19- Cr 1.36- not drinking as well- pushing fluids, recheck Wed. 10. Severe GERD: Will schedule Protonix as IV BID. 11. HTN: Monitor BP tid--stable on amlodipine and Avapro/HCTZ.    Relatively controlled on 10/20             Monitor with increased mobility.  12. Reactive depression: Feels fine at the moment but has had a hard time past 6 months.  Resumed at lower dose and monitor.             Team to provide support.  13. Constipation with ileus:   Added miralax daily and enema  Diet advanced to soft diet   Continue Reglan for now, schedule  qid with meals/bedtime  10/20--sorbitol and SSE today.   -didn't get SSE yesterday, received mg citrate x 1  Continue to adjust bowel meds as necessary 14.  Sleep disturbance  Trazodone DC'd  Patient already receiving Compazine and Benadryl  Improving  LOS: 6 days A FACE TO FACE EVALUATION WAS PERFORMED  Ranelle Oyster 10/21/2018, 9:21 AM

## 2018-10-21 NOTE — Progress Notes (Signed)
Physical Therapy Session Note  Patient Details  Name: Alicia Valdez MRN: [id number] Date of Birth: [birth year]  Today's Date: 10/21/2018 PT Individual Time: 0800-0900; 1500-1530 PT Individual Time Calculation (min): 60 min and 30 min  Short Term Goals: Week 1:  PT Short Term Goal 1 (Week 1): pt will perform bed to/from wc w/supervision w/LRAD PT Short Term Goal 2 (Week 1): Gait 108ft w/RW and min assist PT Short Term Goal 3 (Week 1): Bed mobility mod I w/rails  Skilled Therapeutic Interventions/Progress Updates:    Session 1: Pt received seated in w/c at sink, agreeable to PT session. Pt reports some abdominal discomfort this AM 2/2 no BM for several days. No other complaints of pain. Pt is dependent to don TED hose and shoes for time conservation. Sit to stand with CGA to RW. Ambulation 2 x 80', 1 x 90' with RW and CGA, wide BOS, decreased BLE clearance with gait, flexed trunk posture. Pt continues to fatigue quickly with gait and requires v/c for safe RW management during transfers. Sit to stand 3 x 5 reps from progressively lower mat table with no AD, CGA progressing to min A with decrease in surface height. Standing mini-squats 2 x 10 reps with BUE support for BLE strengthening. Static standing balance with no AD performing horseshoe toss with min A for balance. Ambulation with RW navigating obstacle course around cones to simulate home environment, 2 x 30' with RW and CGA for balance. Stand pivot transfer w/c to recliner with CGA and RW. Pt left seated in recliner with needs in reach at end of session.  Session 2: Pt received seated in recliner in room, agreeable to PT session. No complaints of pain. Sit to stand with CGA to RW. Ambulation x 50 ft with RW and CGA. Pt exhibits decreased BLE clearance during gait. Manual w/c propulsion x 100 ft with use of BUE and Supervision. Nustep level 3 x 10 min with use of B UE/LE for global endurance training. Stand pivot transfer w/c to recliner with RW  and CGA. Pt left seated in recliner in room with needs in reach at end of session.  Therapy Documentation Precautions:  Precautions Precautions: Back Required Braces or Orthoses: Other Brace Other Brace: AFO Restrictions Weight Bearing Restrictions: No    Therapy/Group: Individual Therapy   Excell Seltzer, PT, DPT  10/21/2018, 12:35 PM

## 2018-10-21 NOTE — Progress Notes (Signed)
Occupational Therapy Session Note  Patient Details  Name: Alicia Valdez MRN: [id number] Date of Birth: February 16, 1935  Today's Date: 10/21/2018 OT Individual Time: 1000-1056 and 1335-1430 OT Individual Time Calculation (min): 56 min and 55 min   Short Term Goals: Week 1:  OT Short Term Goal 1 (Week 1): Pt will complete LB bathing with min assist sit to stand using AE PRN. OT Short Term Goal 2 (Week 1): Pt will complete walk-in shower transfers with min assist using the RW and shower seat/bench. OT Short Term Goal 3 (Week 1): Pt will donn her underpants and pants with min assist sit to stand. OT Short Term Goal 4 (Week 1): Pt will complete toilet transfers and toileting with no more than min assist for 2 consecutive sessions.  Skilled Therapeutic Interventions/Progress Updates:    Session One: Pt seen for OT session focusing on functional mobility and transfers. Pt sitting up in recliner upon arrival, denying pain and agreeable to tx session. SHe denied bathing/dressing routine this morning.  She completed sit<>Stand throughout session with CGA-supervision overall using RW with VCs for hand placement. She ambulated short distances with CGA and VCs for RW management in functional context. She self propelled w/c to ADL apartment for UE strengthening/endurance. Ambulated within apartment and completed sit<>Stand from low soft surface recliner. Required mod A to stand from low surface.  Education/discussion while pt resting in recliner regarding d/c planning, POC, OT/PT goals, activity progression and continuum of care. Pt agrees she will need more time in rehab in order to achieve supervision-mod I level and using RW.  Ambulated into ADL kitchen and practiced retrieving and replacing items in overhead cabinet with steadying assist. Pt tolerated ~2 minutes in standing before requiring seated rest break.  In therapy gym, completed ambulation with RW weaving btwn cones with emphasis on upright posture and  RW management. Completed x2 trials with seated rest breaks btwn trials.  Pt requesting to return to room in w/c 2/2 fatigue. Once in room, ambulated into bathroom and completed toileting task with min A. She transitioned to recliner and left seated in recliner with all needs in reach and chair pad alarm on.   Session Two: PT seen for OT session focusing on sit<>STand and standing balance/endurance. Pt reclined back in recliner upon arrival, voiced 3/10 pain at surgical site, RN already aware and had amindistered pain medication prior to therapist's arrival.  Pt completed sit<>Stand throughout session with close supervision and VCs for hand placement and technique. In therapy gym, completed dynamic standing task, reaching across midline and outside BOS in order to place items on target, alternating UE support progressing to no UE support to complete task. Close supervision with VCs for upright posture. Tolerating ~1-2 minutes in standing before requiring seated rest break.  Completed x5 sit>stand from w/c without UE support. Initially min-mod A to power into standing progressing to CGA with VCs for technique. Yoga block placed btwn feet to facilitate wider BOS.  In hallway, addressed functional ambulation with visual cues provided to assist in maintaining wider BOS when walking. Pt responded well and reports feeling more stable in standing/walking with this visual cue.  Pt returned to room at end of session, left seated in recliner with all needs in reach and chair pad alarm on.   Therapy Documentation Precautions:  Precautions Precautions: Back Required Braces or Orthoses: Other Brace Other Brace: AFO Restrictions Weight Bearing Restrictions: No   Therapy/Group: Individual Therapy  Nanie Dunkleberger L 10/21/2018, 6:54 AM

## 2018-10-22 ENCOUNTER — Inpatient Hospital Stay (HOSPITAL_COMMUNITY): Payer: Medicare PPO

## 2018-10-22 ENCOUNTER — Inpatient Hospital Stay (HOSPITAL_COMMUNITY): Payer: Medicare PPO | Admitting: Occupational Therapy

## 2018-10-22 ENCOUNTER — Inpatient Hospital Stay (HOSPITAL_COMMUNITY): Payer: Medicare PPO | Admitting: Physical Therapy

## 2018-10-22 LAB — BASIC METABOLIC PANEL
Anion gap: 10 (ref 5–15)
BUN: 22 mg/dL (ref 8–23)
CO2: 28 mmol/L (ref 22–32)
Calcium: 9 mg/dL (ref 8.9–10.3)
Chloride: 96 mmol/L — ABNORMAL LOW (ref 98–111)
Creatinine, Ser: 1.09 mg/dL — ABNORMAL HIGH (ref 0.44–1.00)
GFR calc Af Amer: 56 mL/min — ABNORMAL LOW (ref >60)
GFR calc non Af Amer: 48 mL/min — ABNORMAL LOW (ref >60)
Glucose, Bld: 88 mg/dL (ref 70–99)
Potassium: 3.5 mmol/L (ref 3.5–5.1)
Sodium: 134 mmol/L — ABNORMAL LOW (ref 135–145)

## 2018-10-22 MED ORDER — SENNOSIDES-DOCUSATE SODIUM 8.6-50 MG PO TABS
2.0000 | ORAL_TABLET | Freq: Every day | ORAL | Status: DC
  Administered 2018-10-22 – 2018-10-23 (×2): 2 via ORAL
  Filled 2018-10-22 (×5): qty 2

## 2018-10-22 NOTE — Progress Notes (Signed)
Physical Therapy Session Note  Patient Details  Name: Alicia Valdez MRN: [id number] Date of Birth: 12-24-1935  Today's Date: 10/22/2018 PT Individual Time: 1330-1425 PT Individual Time Calculation (min): 55 min   Short Term Goals: Week 1:  PT Short Term Goal 1 (Week 1): pt will perform bed to/from wc w/supervision w/LRAD PT Short Term Goal 2 (Week 1): Gait 63ft w/RW and min assist PT Short Term Goal 3 (Week 1): Bed mobility mod I w/rails  Skilled Therapeutic Interventions/Progress Updates:    Pt reclined in the recliner upon PT arrival, pt reports feeling better this afternoon and agreeable to therapy tx and denies pain. Pt performed stand pivot to w/c with CGA and transported to the gym. Pt ambulated x 40 ft to the mat with RW and CGA. Pt performed x 10 sit<>stands from elevated mat this session with CGA working on LE strength, then performed x 10 from lowered mat with min assist. Orthotist present this session to assess pt's gait and provide orthotic recommendations. Pt ambulated x 30 ft CGA with R AFO that was given to her on acute, pt reports that this brace is painful on R side of foot and ankle. Pt then ambulated x 60 ft without AFO, using RW and with CGA, pt noted to have R knee flexion in stance (baseline from previous knee replacement) and lack of foot clearance with L LE. Therapist and orthotist agree that R AFO would not benefit pt in this case, pt would possibly benefit from L toe cap or foot up to assist with foot clearance. Pt ambulated x 40 ft to parallel bars with RW and CGA. Pt ambulated within parallel bars and then began standing therex exercises for strengthening including hip abduction and heel raises. Pt reports beginning to feel "woozy" and assisted to sitting. Therapist checked vitals- BP 119/62, HR 76 bpm and SpO2 100%. Pt reports "I just need to lay down, I do not feel well." Pt transferred to w/c stand pivot CGA with RW and transported back to room. Transferred to bed stand  pivot CGA with RW and sit>supine with min assist. Pt left supine in bed with needs in reach and bed alarm set, RN notified.    Therapy Documentation Precautions:  Precautions Precautions: Back Required Braces or Orthoses: Other Brace Other Brace: AFO Restrictions Weight Bearing Restrictions: No    Therapy/Group: Individual Therapy  Netta Corrigan, PT, DPT, CSRS 10/22/2018, 1:11 PM

## 2018-10-22 NOTE — Patient Care Conference (Signed)
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 Alteration in bowel elimination due to postoperative ileus ([place])   . Sleep disturbance   . Ileus, postoperative ([place]) 10/16/2018  . Myelopathy ([place]) 10/15/2018  . Surgery, elective   . AKI (acute kidney injury) ([place])   . Hypokalemia   . Post-operative pain   . Reactive depression   . Drug induced constipation   . Benign essential HTN   . 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 GERD (gastroesophageal reflux disease) 10/29/2016  . Impaired mobility and activities of daily living 10/29/2016  . Iron deficiency anemia due to chronic blood loss 10/29/2016  . Essential hypertension 05/20/2015  . Leg weakness, bilateral 05/19/2015  . Transverse myelitis ([place]) 05/16/2015  . Paraesophageal hernia 11/12/2012  . Microcytic anemia 08/03/2012  . Hyperglycemia 08/03/2012  . C1 cervical fracture ([place]) 08/03/2012  . Atypical chest pain 08/03/2012  . Dysphagia 08/03/2012  . DDD (degenerative disc disease), lumbosacral 01/25/2012    Expected Discharge Date: Expected Discharge Date: 10/28/18  Team Members Present: Physician leading conference: Dr. Delice Lesch Social Worker Present: Lennart Pall, LCSW Nurse Present: Judee Clara, LPN PT Present: Excell Seltzer, PT OT Present: Amy Rounds, OT SLP Present: Weston Anna, SLP PPS Coordinator present : Gunnar Fusi, SLP     Current Status/Progress Goal Weekly Team Focus  Bowel/Bladder   Continent of B/B. Had 3 bowel movements yesterday. Pt reports that she is not  feeling constipated anymore  Remains continent of B/B  Assist with toileting needs, pt is mostly standby assist   Swallow/Nutrition/ Hydration             ADL's   Steadying assist toileting, CGA functional transfers, min A LB dressing using AE, CGA bathing  Supervision overall  ADL/IADL re-training, functional standing balance/endurance, neuro re-ed, family education and d/c planning.   Mobility   Supervision bed mobility, CGA transfers with RW, gait up to 83' with RW  mod I  endurance, LE strengthening   Communication             Safety/Cognition/ Behavioral Observations            Pain   Denies pain on this shift  Remains pain free  Assess for pain q shift   Skin   Pt reports itching, otherwise surgical site is clean and dry  Free from infection to LP site  Assess skin q shift and prn    Rehab Goals Patient on target to meet rehab goals: Yes *See Care Plan and progress notes for long and short-term goals.     Barriers to Discharge  Current Status/Progress Possible Resolutions Date Resolved   Nursing  Medical stability               PT  Decreased caregiver support                 OT  SLP                SW                Discharge Planning/Teaching Needs:  Plan home with spouse and son who can provide 24/7 assistance.  Teaching to be planned prior to d/c.   Team Discussion: Potassium supplements ordered, creatinine elevated, ileus over weekend is improving, sleeping better.  Dizzy and nausea with hypotension.  PT close supervision to CGA overall, cuing for safety.  PT CGA/min A transfers, working on endurance, ambulated 90 feet CGA.  Has S goals.   Revisions to Treatment Plan:  N/A    Medical Summary Current Status: Ataxia, balance deficits, lower extremity weakness, limitation in self-care secondary to lumbar spine reexploration with repair of CSF leak. Weekly Focus/Goal: Improve mobility, ileus, self-care, sleep, AKI, hypokalemia  Barriers to  Discharge: Medical stability   Possible Resolutions to Barriers: Therapies, follow labs, optimize bowel meds   Continued Need for Acute Rehabilitation Level of Care: The patient requires daily medical management by a physician with specialized training in physical medicine and rehabilitation for the following reasons: Direction of a multidisciplinary physical rehabilitation program to maximize functional independence : Yes Medical management of patient stability for increased activity during participation in an intensive rehabilitation regime.: Yes Analysis of laboratory values and/or radiology reports with any subsequent need for medication adjustment and/or medical intervention. : Yes   I attest that I was present, lead the team conference, and concur with the assessment and plan of the team.   [name] 10/22/2018, 11:34 AM  Team conference was held via web/ teleconference due to COVID - 19

## 2018-10-22 NOTE — Progress Notes (Signed)
Occupational Therapy Session Note  Patient Details  Name: Alicia Valdez MRN: [id number] Date of Birth: 01-31-1935  Today's Date: 10/22/2018 OT Individual Time: 1100-1140 OT Individual Time Calculation (min): 40 min  and Today's Date: 10/22/2018 OT Missed Time: 20 Minutes Missed Time Reason: Patient fatigue   Short Term Goals: Week 1:  OT Short Term Goal 1 (Week 1): Pt will complete LB bathing with min assist sit to stand using AE PRN. OT Short Term Goal 2 (Week 1): Pt will complete walk-in shower transfers with min assist using the RW and shower seat/bench. OT Short Term Goal 3 (Week 1): Pt will donn her underpants and pants with min assist sit to stand. OT Short Term Goal 4 (Week 1): Pt will complete toilet transfers and toileting with no more than min assist for 2 consecutive sessions.  Skilled Therapeutic Interventions/Progress Updates:    Pt seen for OT session focusing on functional mobility and AE training. Pt in supine upon arrival, requesting need for toileting task. She voiced feeling slightly better than earlier this AM, however, cont to feel generalized weakness.  She transferred to sitting EOB with supervision using hospital bed functions. She required min A to stand and ambulated into bathroom with RW and min A. Pt requiring VCs for safe RW management and ensuring turned all the way to toilet prior to sitting as pt impulsive with mobility. Toileting task completed with steadying assist.  She returned to recliner. TED hose donned total A. Addressed LB dressing goal. Pt able to cross B LEs into figure four position, min A to obtain with R LE. Pt provided with shoe buttons and education/demonstration provided regarding use. She return demonstrated ability to don/doff shoes using shoe button. Pt excited about new level of independence. Pt then declined any further tx 2/2 fatigue and weakness. Left in recliner with LEs elevated and all needs in reach, RN aware of pt's condition.    Therapy Documentation Precautions:  Precautions Precautions: Back Required Braces or Orthoses: Other Brace Other Brace: AFO Restrictions Weight Bearing Restrictions: No   Therapy/Group: Individual Therapy  Gunnar Hereford L 10/22/2018, 7:02 AM

## 2018-10-22 NOTE — Progress Notes (Signed)
Physical Therapy Session Note  Patient Details  Name: Alicia Valdez MRN: [id number] Date of Birth: 1935/09/06  Today's Date: 10/22/2018 PT Individual Time: 0800-0815 PT Individual Time Calculation (min): 15 min  PT Missed Time: 45 min Missed Time Reason: patient illness (dizzy, low BP)  Short Term Goals: Week 1:  PT Short Term Goal 1 (Week 1): pt will perform bed to/from wc w/supervision w/LRAD PT Short Term Goal 2 (Week 1): Gait 3ft w/RW and min assist PT Short Term Goal 3 (Week 1): Bed mobility mod I w/rails  Skilled Therapeutic Interventions/Progress Updates:    Pt received seated in w/c in room doubled over, reports feeling very dizzy and requesting to transfer to recliner. NT in room to assess vitals, BP 88/61, other vitals WNL. Sit to stand with min A to RW. Stand pivot transfer w/c to bed with min A. Sit to supine mod A for BLE management. Pt left supine in bed in care of RN. Deferred further PT this AM 2/2 low BP. Pt missed 45 min of scheduled therapy due to dizziness and low BP. Will continue per POC.  Therapy Documentation Precautions:  Precautions Precautions: Back Required Braces or Orthoses: Other Brace Other Brace: AFO Restrictions Weight Bearing Restrictions: No    Therapy/Group: Individual Therapy   Excell Seltzer, PT, DPT  10/22/2018, 9:28 AM

## 2018-10-22 NOTE — Progress Notes (Signed)
[place] PHYSICAL MEDICINE & REHABILITATION PROGRESS NOTE   Subjective/Complaints:  +BM's with sorbitol. Feels better today  ROS: Patient denies fever, rash, sore throat, blurred vision, nausea, vomiting, diarrhea, cough, shortness of breath or chest pain,  headache, or mood change.     Objective:   No results found. Recent Labs    10/20/18 0856  WBC 4.9  HGB 13.4  HCT 39.9  PLT 255   Recent Labs    10/20/18 0856 10/22/18 0551  NA 137 134*  K 3.4* 3.5  CL 96* 96*  CO2 29 28  GLUCOSE 112* 88  BUN 26* 22  CREATININE 1.36* 1.09*  CALCIUM 9.5 9.0    Intake/Output Summary (Last 24 hours) at 10/22/2018 0838 Last data filed at 10/22/2018 0752 Gross per 24 hour  Intake 600 ml  Output -  Net 600 ml     Physical Exam: Vital Signs Blood pressure (!) 88/61, pulse 79, temperature 98.1 F (36.7 C), temperature source Oral, resp. rate 16, SpO2 96 %. Constitutional: No distress . Vital signs reviewed. HEENT: EOMI, oral membranes moist Neck: supple Cardiovascular: RRR without murmur. No JVD    Respiratory: CTA Bilaterally without wheezes or rales. Normal effort    GI: BS +, non-tender, non-distended  Skin: Warm and dry.  Intact. Psych: very pleasant Musc: Lower extremity edema trace Neurological: Alert Motor:  Right lower extremity for hip flexion, knee extension 2/5, ankle dorsiflexion 4-/5, improving Left lower extremity: Hip flexion, knee extension 3/5, ankle dorsiflexion 4/5  Assessment/Plan: 1. Functional deficits secondary to Ataxia, balance issues, due to CSF leak s/p reexploration which require 3+ hours per day of interdisciplinary therapy in a comprehensive inpatient rehab setting.  Physiatrist is providing close team supervision and 24 hour management of active medical problems listed below.  Physiatrist and rehab team continue to assess barriers to discharge/monitor patient progress toward functional and medical goals  Care Tool:  Bathing    Body  parts bathed by patient: Right arm, Left arm, Chest, Abdomen, Front perineal area, Buttocks, Right upper leg, Left upper leg, Face   Body parts bathed by helper: Right lower leg, Left lower leg     Bathing assist Assist Level: Minimal Assistance - Patient > 75%     Upper Body Dressing/Undressing Upper body dressing   What is the patient wearing?: Pull over shirt    Upper body assist Assist Level: Supervision/Verbal cueing    Lower Body Dressing/Undressing Lower body dressing      What is the patient wearing?: Underwear/pull up, Pants     Lower body assist Assist for lower body dressing: Minimal Assistance - Patient > 75%     Toileting Toileting    Toileting assist Assist for toileting: Minimal Assistance - Patient > 75%     Transfers Chair/bed transfer  Transfers assist     Chair/bed transfer assist level: Minimal Assistance - Patient > 75%     Locomotion Ambulation   Ambulation assist      Assist level: Minimal Assistance - Patient > 75% Assistive device: Walker-rolling Max distance: 90'   Walk 10 feet activity   Assist     Assist level: Minimal Assistance - Patient > 75% Assistive device: Walker-rolling   Walk 50 feet activity   Assist Walk 50 feet with 2 turns activity did not occur: Safety/medical concerns  Assist level: Minimal Assistance - Patient > 75% Assistive device: Walker-rolling    Walk 150 feet activity   Assist Walk 150 feet activity did not occur: Safety/medical  concerns         Walk 10 feet on uneven surface  activity   Assist Walk 10 feet on uneven surfaces activity did not occur: Safety/medical concerns         Wheelchair     Assist Will patient use wheelchair at discharge?: No      Wheelchair assist level: Supervision/Verbal cueing Max wheelchair distance: 150    Wheelchair 50 feet with 2 turns activity    Assist        Assist Level: Supervision/Verbal cueing   Wheelchair 150 feet  activity     Assist      Assist Level: Supervision/Verbal cueing   Blood pressure (!) 88/61, pulse 79, temperature 98.1 F (36.7 C), temperature source Oral, resp. rate 16, SpO2 96 %.  Medical Problem List and Plan: 1.  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  Continue CIR therapies  -team conf today 2.  Antithrombotics: -DVT/anticoagulation:  Mechanical: Sequential compression devices, below knee Bilateral lower extremities              CBC within normal limits on 10/19    10/20--pt ambulating 100' with PT. Moves both legs well. Probably don't need to pursue lovenox or heparin at this point             -antiplatelet therapy: N/A 3. Pain Management: DC'd Celebrex as likely leading to regurgitation/acid reflux             Monitor with increased activity and upright posture. 4. Mood: LCSW to follow for evaluation and support.              -antipsychotic agents: NA 5. Neuropsych: This patient is capable of making decisions on her own behalf. 6. Skin/Wound Care:  Routine pressure relief measures.  7. Fluids/Electrolytes/Nutrition: Monitor I/O. Marland Kitchen 8.  Hypokalemia:   Potassium 3.5---continue low dose supplement 9.  AKI versus CKD:    Creatinine 1.03 on 10/16   10/19- Cr 1.36----1.09 today.  -continue to push fluids 10. Severe GERD: Will schedule Protonix as IV BID. 11. HTN: Monitor BP tid--stable on amlodipine and Avapro/HCTZ.    Relatively controlled on 10/20             Monitor with increased mobility.  12. Reactive depression: Feels fine at the moment but has had a hard time past 6 months.  Resumed at lower dose and monitor. 13. Constipation with ileus:     miralax daily  Diet advanced to soft diet   Continue Reglan for now, scheduled qid with meals/bedtime  10/20--sorbitol with results    10/21--add senna-s at bedtime  Continue to adjust bowel meds as necessary 14.  Sleep  disturbance  Trazodone DC'd  Patient already receiving Compazine and Benadryl  Improved  LOS: 7 days A FACE TO FACE EVALUATION WAS PERFORMED  Meredith Staggers 10/22/2018, 8:38 AM

## 2018-10-23 ENCOUNTER — Inpatient Hospital Stay (HOSPITAL_COMMUNITY): Payer: Medicare PPO

## 2018-10-23 ENCOUNTER — Inpatient Hospital Stay (HOSPITAL_COMMUNITY): Payer: Medicare PPO | Admitting: Occupational Therapy

## 2018-10-23 DIAGNOSIS — M7989 Other specified soft tissue disorders: Secondary | ICD-10-CM

## 2018-10-23 MED ORDER — IRBESARTAN 75 MG PO TABS: 75.0000 mg | ORAL_TABLET | Freq: Every day | ORAL | Status: DC

## 2018-10-23 MED ORDER — HYDROCHLOROTHIAZIDE 12.5 MG PO CAPS: 12.5000 mg | ORAL_CAPSULE | Freq: Every day | ORAL | Status: DC

## 2018-10-23 MED ORDER — AMLODIPINE BESYLATE 5 MG PO TABS
5.0000 mg | ORAL_TABLET | Freq: Every day | ORAL | Status: DC
  Administered 2018-10-24 – 2018-10-25 (×2): 5 mg via ORAL
  Filled 2018-10-23 (×2): qty 1

## 2018-10-23 NOTE — Progress Notes (Signed)
Social Work Patient ID: Alicia Valdez, female   DOB: Mar 22, 1935, 83 y.o.   MRN: [id number]  Met with pt this morning to review team conference.  She is very pleased with her progress and feels she will be ready for 10/27 d/c.  She and husband are planning to return directly to their home and aware that I will arrange [place].  Discussed needed DME as well.  Continue to follow.  Joaovictor Krone, LCSW

## 2018-10-23 NOTE — Progress Notes (Signed)
Orthopedic Tech Progress Note Patient Details:  Alicia Valdez 10/02/1935 [id number]  Ortho Devices Type of Ortho Device: Abdominal binder Ortho Device/Splint Location: bedside Ortho Device/Splint Interventions: Ordered   Post Interventions Instructions Provided: Care of device   Braulio Bosch 10/23/2018, 2:36 PM

## 2018-10-23 NOTE — Progress Notes (Signed)
Lower extremity venous has been completed.   Preliminary results in CV Proc.   Alicia Valdez 10/23/2018 11:44 AM

## 2018-10-23 NOTE — Progress Notes (Signed)
Assessment: Physical Therapy Session Note  Patient Details  Name: Alicia Valdez MRN: [id number] Date of Birth: 1935-01-10  Today's Date: 10/23/2018 PT Individual Time: 7026-3785 and 925 to 1028 PT Individual Time Calculation (min): 75 min and 63 min  Short Term Goals: Week 1:  PT Short Term Goal 1 (Week 1): pt will perform bed to/from wc w/supervision w/LRAD PT Short Term Goal 1 - Progress (Week 1): Progressing toward goal PT Short Term Goal 2 (Week 1): Gait 80f w/RW and min assist PT Short Term Goal 2 - Progress (Week 1): Met PT Short Term Goal 3 (Week 1): Bed mobility mod I w/rails PT Short Term Goal 3 - Progress (Week 1): Met Week 2:  PT Short Term Goal 1 (Week 2): =LTG due to ELOS Week 3:     Skilled Therapeutic Interventions/Progress Updates:    AM Session: PAIN denies pain Pt initially oob in recliner.  STS from recliner w/supervision.  Short distance gait to wc w/RW and cga.  wc propulsion 1060fw/bilat UE/s and mod I. Gait [id number] and cga, verbal cues for posture and to maintain safe distance from wlaker.  overall flexed posture, r knee flexion throughout stance, mild forward tendency, decreased clearance LLE which increases w/fatigue.   Discussed equipment needs, pt stated that she has walker but wheels are not functioning, will follow up w/social worker to address ordering new walker for dc.   Repeated STS from mat x 12 reps without use of hands for LE strengthening and balance w/transition  Initiated stair training today.  Ascends/descends 4 stairs w/2 rails w/cga only, step to gait pattern. Moves slowly, cautiously, and safely.  Static standing balance without RW w/alternating arm raises and cga.  Pt c/o mild dizzyness, returned to sitting w/cues for safety.  dizzyness resolved immediately.  Seated LE strengthening including wc propulsion [id number]'s only for hs strengthening Seated LAQ's 3 x10 w/#3lb wt Seated isometric hip adduction w/ball 2x10 w/3 sec  hold  Pt transported to room by therapist due to fatigue. STS from wc w/supervision, stand turn transfer to recliner w/cga w/RW.   Pt left OOB in recliner w/needs in reach.  PM SESSION PAIN  Pt denies pain.  Stated that she started to have hip pain but nurses provided pain meds and now resolved.  STS from recliner w/cga.  Recliner to wc SPT w/RW and close supervision.   wc propulsion x 25ft 14flat ue's, pt requested assist for remainder of distance to gym due to 75min 65mion/energy conservation.  wc to NuStep SPT w/RW and supervision.  Assist for positioining/set up on machine. NuStep x 11 min for cardiovascular conditioning and general strengthening L3, RPE 5/10 Total steps   870 Pt educated re:  Toe cap vs toe up splint.  Pt  Stated preference for toe up splint.  Using demo toe up splint from rehab dept. Pt educated w/process for positioning in shoe and donning. Gait 50ft, 425fw/R74fd toe up w/improved clearance noted w/splint vs without.  Pt stated she felt "more secure" w/splint.   Pt also stated that she felt her episodes of "not feeling well" related to the fact that she did not have her abdominal binder that she wears at home at all times.  Requested nursing order one for pt for possible improved tolerance for activity.  Transported to room via wc due to time. wc to recliner SPT w/o AD and w/min assist. Pt left OOB in recliner w/all needs in reach.    Therapy Documentation  Precautions:  Precautions Precautions: Back Required Braces or Orthoses: Other Brace Other Brace: AFO Restrictions Weight Bearing Restrictions: No  Therapy/Group: Individual Therapy  Callie Fielding, [place] 10/23/2018, 3:55 PM

## 2018-10-23 NOTE — Progress Notes (Signed)
[employer] PHYSICAL MEDICINE & REHABILITATION PROGRESS NOTE   Subjective/Complaints:  Feeling well. Pleased with functional progress. Appetite is much better  ROS: Patient denies fever, rash, sore throat, blurred vision, nausea, vomiting, diarrhea, cough, shortness of breath or chest pain,   headache, or mood change.   Objective:   No results found. No results for input(s): WBC, HGB, HCT, PLT in the last 72 hours. Recent Labs    10/22/18 0551  NA 134*  K 3.5  CL 96*  CO2 28  GLUCOSE 88  BUN 22  CREATININE 1.09*  CALCIUM 9.0    Intake/Output Summary (Last 24 hours) at 10/23/2018 1004 Last data filed at 10/23/2018 0700 Gross per 24 hour  Intake 312 ml  Output -  Net 312 ml     Physical Exam: Vital Signs Blood pressure 103/64, pulse 94, temperature 99 F (37.2 C), temperature source Oral, resp. rate 18, SpO2 94 %. Constitutional: No distress . Vital signs reviewed. HEENT: EOMI, oral membranes moist Neck: supple Cardiovascular: RRR without murmur. No JVD    Respiratory: CTA Bilaterally without wheezes or rales. Normal effort    GI: BS +, non-tender, non-distended  Skin: Warm and dry.  Back incision CDI Psych: very pleasant Musc: Lower extremity edema trace Neurological: Alert Motor:  Right lower extremity for hip flexion, knee extension 2+/5, ankle dorsiflexion 4-/5, improving Left lower extremity: Hip flexion, knee extension 3/5, ankle dorsiflexion 4/5  Assessment/Plan: 1. Functional deficits secondary to Ataxia, balance issues, due to CSF leak s/p reexploration which require 3+ hours per day of interdisciplinary therapy in a comprehensive inpatient rehab setting.  Physiatrist is providing close team supervision and 24 hour management of active medical problems listed below.  Physiatrist and rehab team continue to assess barriers to discharge/monitor patient progress toward functional and medical goals  Care Tool:  Bathing    Body parts bathed by patient:  Right arm, Left arm, Chest, Abdomen, Front perineal area, Buttocks, Right upper leg, Left upper leg, Face, Right lower leg, Left lower leg   Body parts bathed by helper: Right lower leg, Left lower leg     Bathing assist Assist Level: Supervision/Verbal cueing     Upper Body Dressing/Undressing Upper body dressing   What is the patient wearing?: Pull over shirt, Bra    Upper body assist Assist Level: Independent    Lower Body Dressing/Undressing Lower body dressing      What is the patient wearing?: Underwear/pull up, Pants     Lower body assist Assist for lower body dressing: Supervision/Verbal cueing     Toileting Toileting    Toileting assist Assist for toileting: Contact Guard/Touching assist     Transfers Chair/bed transfer  Transfers assist     Chair/bed transfer assist level: Contact Guard/Touching assist     Locomotion Ambulation   Ambulation assist      Assist level: Contact Guard/Touching assist Assistive device: Walker-rolling Max distance: 3ft   Walk 10 feet activity   Assist     Assist level: Contact Guard/Touching assist Assistive device: Walker-rolling   Walk 50 feet activity   Assist Walk 50 feet with 2 turns activity did not occur: Safety/medical concerns  Assist level: Contact Guard/Touching assist Assistive device: Walker-rolling    Walk 150 feet activity   Assist Walk 150 feet activity did not occur: Safety/medical concerns         Walk 10 feet on uneven surface  activity   Assist Walk 10 feet on uneven surfaces activity did not occur: Safety/medical  concerns         Wheelchair     Assist Will patient use wheelchair at discharge?: No      Wheelchair assist level: Supervision/Verbal cueing Max wheelchair distance: 150    Wheelchair 50 feet with 2 turns activity    Assist        Assist Level: Supervision/Verbal cueing   Wheelchair 150 feet activity     Assist      Assist Level:  Supervision/Verbal cueing   Blood pressure 103/64, pulse 94, temperature 99 F (37.2 C), temperature source Oral, resp. rate 18, SpO2 94 %.  Medical Problem List and Plan: 1.  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  Continue CIR therapies    2.  Antithrombotics: -DVT/anticoagulation:  Mechanical: Sequential compression devices, below knee Bilateral lower extremities              CBC within normal limits on 10/19    10/22---didn't ambulate as much yesterday  -think it's worth while to check dopplers today             -antiplatelet therapy: N/A 3. Pain Management: DC'd Celebrex as likely leading to regurgitation/acid reflux             Monitor with increased activity and upright posture. 4. Mood: LCSW to follow for evaluation and support.              -antipsychotic agents: NA 5. Neuropsych: This patient is capable of making decisions on her own behalf. 6. Skin/Wound Care:  Routine pressure relief measures.  7. Fluids/Electrolytes/Nutrition: Monitor I/O. Marland Kitchen 8.  Hypokalemia:   Potassium 3.5-10/21--continue low dose supplement 9.  AKI versus CKD:    Creatinine 1.03 on 10/16   10/19- Cr 1.36----1.09 10/21  -continue to push fluids 10. Severe GERD: Will schedule Protonix as IV BID. 11. HTN: Monitor BP tid--stable on amlodipine and Avapro/HCTZ.    BP's soft at rest. Has been hypotensive with therapies  -reduce amlodipine  -hold avapro/hctz pending labs in am              12. Reactive depression: Feels fine at the moment but has had a hard time past 6 months.  Resumed at lower dose and monitor. 13. Constipation with ileus: resolved    miralax daily  Diet advanced to soft diet   Reduce reglan to 5mg  tid ac 10/22  10/20--sorbitol with results    10/21--added senna-s at bedtime  Continue to adjust bowel meds as necessary 14.  Sleep disturbance  Trazodone DC'd  Patient already  receiving Compazine and Benadryl  Improved  LOS: 8 days A FACE TO FACE EVALUATION WAS PERFORMED  Meredith Staggers 10/23/2018, 10:04 AM

## 2018-10-23 NOTE — Plan of Care (Signed)
  Problem: Consults Goal: RH SPINAL CORD INJURY PATIENT EDUCATION Description:  See Patient Education module for education specifics.  Outcome: Progressing Goal: Skin Care Protocol Initiated - if Braden Score 18 or less Description: If consults are not indicated, leave blank or document N/A Outcome: Progressing   Problem: SCI BOWEL ELIMINATION Goal: RH STG MANAGE BOWEL WITH ASSISTANCE Description: STG Manage Bowel with mod I Assistance. Outcome: Progressing Goal: RH STG SCI MANAGE BOWEL WITH MEDICATION WITH ASSISTANCE Description: STG SCI Manage bowel with medication with mod I assistance. Outcome: Progressing Goal: RH STG SCI MANAGE BOWEL PROGRAM W/ASSIST OR AS APPROPRIATE Description: STG SCI Manage bowel program w/ mod I assist or as appropriate. Outcome: Progressing   Problem: SCI BLADDER ELIMINATION Goal: RH STG SCI MANAGE BLADDER PROGRAM W/ASSISTANCE Description: Patient will verbalize understanding of bladder program including PVR checks and intermittent caths as needed to empty bladder. Outcome: Progressing   Problem: RH SKIN INTEGRITY Goal: RH STG SKIN FREE OF INFECTION/BREAKDOWN Description: Patients skin will remain free from further infection or breakdown with mod I assist. Outcome: Progressing Goal: RH STG MAINTAIN SKIN INTEGRITY WITH ASSISTANCE Description: STG Maintain Skin Integrity With mod I Assistance. Outcome: Progressing   Problem: RH PAIN MANAGEMENT Goal: RH STG PAIN MANAGED AT OR BELOW PT'S PAIN GOAL Description: < 3 Outcome: Progressing   

## 2018-10-23 NOTE — Progress Notes (Signed)
Occupational Therapy Weekly Progress Note  Patient Details  Name: Alicia Valdez MRN: [id number] Date of Birth: 1935-12-03  Beginning of progress report period: October 16, 2018 End of progress report period: October 23, 2018  Today's Date: 10/23/2018 OT Individual Time: 0730-0830 OT Individual Time Calculation (min): 60 min    Patient has met 4 of 4 short term goals.  Pt is making steady progress towards OT goals. Pt performing at overall CGA, requiring VCs for safety awareness as pt can be impulsive with decreased awareness of deficits. Several episodes of low BP limiting pt's ability to participate in tx sessions. She uses AE for bathing/dressing. RW for short distance functional ambulation and standing balance.   Patient continues to demonstrate the following deficits: muscle weakness, impaired timing and sequencing, unbalanced muscle activation and decreased coordination and decreased standing balance, decreased postural control and decreased balance strategies.  and therefore will continue to benefit from skilled OT intervention to enhance overall performance with BADL and Reduce care partner burden.  Patient progressing toward long term goals..  Continue plan of care.  OT Short Term Goals Week 1:  OT Short Term Goal 1 (Week 1): Pt will complete LB bathing with min assist sit to stand using AE PRN. OT Short Term Goal 1 - Progress (Week 1): Met OT Short Term Goal 2 (Week 1): Pt will complete walk-in shower transfers with min assist using the RW and shower seat/bench. OT Short Term Goal 2 - Progress (Week 1): Met OT Short Term Goal 3 (Week 1): Pt will donn her underpants and pants with min assist sit to stand. OT Short Term Goal 3 - Progress (Week 1): Met OT Short Term Goal 4 (Week 1): Pt will complete toilet transfers and toileting with no more than min assist for 2 consecutive sessions. OT Short Term Goal 4 - Progress (Week 1): Met Week 2:  OT Short Term Goal 1 (Week 2): STG=LTG due  to LOS  Skilled Therapeutic Interventions/Progress Updates:    Pt seen for OT ADL bathing/dressing session. Pt awake sitting up in recliner upon arrival, agreeable to tx session and denying pain.  She ambulated throughout room with RW and close supervision. Cont to require VC for safety awareness and RW management in functional context.  She bathed seated on tub transfer bench with set-up/supervision, standing with use of grab bas to complete pericare/buttock hygiene. She returned to w/c to dress, mod I UB dressing, using AE for LB dressing with supervision/VCs. Total A for TED hose and pt donned shoes, recalling technique for use of shoe buttons.  Grooming tasks completed from w/c level mod I at sink.  She ambulated within bathroom to gather towels from floor using reacher with close supervision and VCs for safety.  Pt returned to recliner at end of session, left with all needs in reach. Education throughout session regarding POC, OT/PT goals, continuum of care, DME and d/c planning. Plan for pt's husband to come in for hands on training prior to d/c home next week.   Therapy Documentation Precautions:  Precautions Precautions: Back Required Braces or Orthoses: Other Brace Other Brace: AFO Restrictions Weight Bearing Restrictions: No   Therapy/Group: Individual Therapy  Avril Busser L 10/23/2018, 6:55 AM

## 2018-10-23 NOTE — Progress Notes (Signed)
Physical Therapy Weekly Progress Note  Patient Details  Name: Alicia Valdez MRN: [id number] Date of Birth: 01-06-1935  Beginning of progress report period: October 16, 2018 End of progress report period: October 23, 2018  Today's Date: 10/23/2018    Patient has met 2 of 3 short term goals.  Pt is at mod I level for bed mobility, Supervision to CGA for transfers with RW, and is able to ambulate up to 100 ft with use of RW and with CGA. Pt is making good progress towards overall LTG.  Patient continues to demonstrate the following deficits muscle weakness, decreased cardiorespiratoy endurance and decreased standing balance and decreased balance strategies and therefore will continue to benefit from skilled PT intervention to increase functional independence with mobility.  Patient progressing toward long term goals..  Continue plan of care.  PT Short Term Goals Week 1:  PT Short Term Goal 1 (Week 1): pt will perform bed to/from wc w/supervision w/LRAD PT Short Term Goal 1 - Progress (Week 1): Progressing toward goal PT Short Term Goal 2 (Week 1): Gait 14f w/RW and min assist PT Short Term Goal 2 - Progress (Week 1): Met PT Short Term Goal 3 (Week 1): Bed mobility mod I w/rails PT Short Term Goal 3 - Progress (Week 1): Met Week 2:  PT Short Term Goal 1 (Week 2): =LTG due to ELOS   Therapy Documentation Precautions:  Precautions Precautions: Back Required Braces or Orthoses: Other Brace Other Brace: AFO Restrictions Weight Bearing Restrictions: No   Therapy/Group: Individual Therapy   TExcell Seltzer PT, DPT  10/23/2018, 7:38 AM

## 2018-10-24 ENCOUNTER — Inpatient Hospital Stay (HOSPITAL_COMMUNITY): Payer: Medicare PPO

## 2018-10-24 ENCOUNTER — Inpatient Hospital Stay (HOSPITAL_COMMUNITY): Payer: Medicare PPO | Admitting: Physical Therapy

## 2018-10-24 ENCOUNTER — Inpatient Hospital Stay (HOSPITAL_COMMUNITY): Payer: Medicare PPO | Admitting: Occupational Therapy

## 2018-10-24 LAB — BASIC METABOLIC PANEL
Anion gap: 8 (ref 5–15)
BUN: 26 mg/dL — ABNORMAL HIGH (ref 8–23)
CO2: 30 mmol/L (ref 22–32)
Calcium: 9.4 mg/dL (ref 8.9–10.3)
Chloride: 98 mmol/L (ref 98–111)
Creatinine, Ser: 1.19 mg/dL — ABNORMAL HIGH (ref 0.44–1.00)
GFR calc Af Amer: 50 mL/min — ABNORMAL LOW (ref >60)
GFR calc non Af Amer: 43 mL/min — ABNORMAL LOW (ref >60)
Glucose, Bld: 98 mg/dL (ref 70–99)
Potassium: 4.3 mmol/L (ref 3.5–5.1)
Sodium: 136 mmol/L (ref 135–145)

## 2018-10-24 MED ORDER — IRBESARTAN 75 MG PO TABS: 75.0000 mg | ORAL_TABLET | Freq: Every day | ORAL | Status: DC

## 2018-10-24 MED ORDER — HYDROCHLOROTHIAZIDE 12.5 MG PO CAPS: 12.5000 mg | ORAL_CAPSULE | Freq: Every day | ORAL | Status: DC

## 2018-10-24 NOTE — Progress Notes (Signed)
Physical Therapy Session Note  Patient Details  Name: Alicia Valdez MRN: [id number] Date of Birth: 06/06/1935  Today's Date: 10/24/2018 PT Individual Time: 1100-1200 PT Individual Time Calculation (min): 60 min   Short Term Goals: Week 2:  PT Short Term Goal 1 (Week 2): =LTG due to ELOS  Skilled Therapeutic Interventions/Progress Updates:    Pt received seated in recliner in room, agreeable to PT session. No complaints of pain. Pt attempts to don abdominal binder, is too small. Per pt report she did not use an abdominal binder at home but describes more of a lumbar corset for support. Sit to stand with Supervision to RW. Orthotist present during therapy session to provide "toe-up" brace for LLE. Ambulation 3 x 100 ft with RW and CGA. Pt exhibits improved LLE clearance with use of "toe-up" brace. Pt continues to remain limited in ambulation distance by decreased endurance. Ambulation through obstacle course with RW and CGA navigating around cones and stepping over objects safely with RW, min cueing for safe RW management. Static standing balance on airex while engaging in card game with BUE, pt tolerates standing x 2:30 then x :30 with CGA for balance. Toilet transfer with Supervision. Pt is Supervision for clothing management and pericare. Pt left seated in recliner in room with needs in reach at end of session.  Therapy Documentation Precautions:  Precautions Precautions: Back Required Braces or Orthoses: Other Brace Other Brace: AFO Restrictions Weight Bearing Restrictions: No     Therapy/Group: Individual Therapy   Excell Seltzer, PT, DPT  10/24/2018, 3:59 PM

## 2018-10-24 NOTE — Progress Notes (Signed)
Physical Therapy Session Note  Patient Details  Name: Alicia Valdez MRN: [id number] Date of Birth: 09-Jun-1935  Today's Date: 10/24/2018 PT Individual Time: 1615-1700 PT Individual Time Calculation (min): 45 min   Short Term Goals: Week 1:  PT Short Term Goal 1 (Week 1): pt will perform bed to/from wc w/supervision w/LRAD PT Short Term Goal 1 - Progress (Week 1): Progressing toward goal PT Short Term Goal 2 (Week 1): Gait 49f w/RW and min assist PT Short Term Goal 2 - Progress (Week 1): Met PT Short Term Goal 3 (Week 1): Bed mobility mod I w/rails PT Short Term Goal 3 - Progress (Week 1): Met  Skilled Therapeutic Interventions/Progress Updates: Pt sitting upright in recliner upon arrival for therapy. Pt agreeable to treatment. Pt requesting to use the bathroom. Pt ambulated into the bathroom with RW and CGA. +void Pt completed own pericare while sitting and stood to don pants. Pt washed hands while standing and returned to wheelchair. Pt transported in wheelchair to outside of the hospital for some fresh air and community ambulation. Pt ambulated the following distances: 40 feet, 65 feetx2, 120 feetx2 with RW and CGA. Pt navigated uphill and downhill surfaces in addition to navigating between light poles with RW and CGA. Pt required verbal cueing with AD management on entrance mats to hospital. Pt self propelled chair inside of hospital lobby about 50 feet. Pt returned to room. Pt transferred from wheelchair>recliner with RW and CGA. Pt positioned in recliner, left sitting upright, with call bell within reach. All needs met at this time.     Therapy Documentation Precautions:  Precautions Precautions: Back Required Braces or Orthoses: Other Brace Other Brace: AFO Restrictions Weight Bearing Restrictions: No  Pain: denies pain at rest    Therapy/Group: Individual Therapy  KOlena Leatherwood SPT  10/24/2018, 5:51 PM

## 2018-10-24 NOTE — Progress Notes (Signed)
Occupational Therapy Session Note  Patient Details  Name: Alicia Valdez MRN: [id number] Date of Birth: 09-Feb-1935  Today's Date: 10/24/2018 OT Individual Time: 7517-0017 OT Individual Time Calculation (min): 60 min    Short Term Goals:  Week 2:  OT Short Term Goal 1 (Week 2): STG=LTG due to LOS      Skilled Therapeutic Interventions/Progress Updates:    Pt received in recliner, no c/o pain and ready for tx. Pt completes functional mobility to sink and completes UB bathing with (S) while seated in w/c. Completes LB bathing washing buttocks and B UE only with CGA for balance while standing and min VC to maintain precautions. Pt complete UB dressing with (S) and LB dressing with (S) and min VC to use AE and maintain bending precaution. Slight drainage noted in lumbar incision; RN notified and applied bandage. Pt is total A for TEDS and (S) for donning shoes with use of AE. Pt completes functional mobility to therapy apartment with RW and CGA. Pt reporting needing one rest break. In kitchen pt completed functional kitchen task to obtain items in cabinets and drawers. Task addressed RW management, maintaining precautions in home environment and endurance. Pt completed task with CGA and OT educated on placing frequently used items on higher shelving or out on the counter top at home. Pt completed getting in and out of bed in therapy apartment to simulate home environment; pt demonstrated log rolling technique and proper body mechanics for coming supine <> EOB and EOB <> supine. Pt completed toe tapping execise on cones placed in front to address functional standing balance and activity tolerance; required CGA for balance and frequent rest breaks for leg weakness. Pt completed 1 trial of 10 taps followed by 1 trial of 5 taps. Exited session with pt in reclined, chair alarm set and husband present in room.    Therapy Documentation Precautions:  Precautions Precautions: Back Required Braces or Orthoses:  Other Brace Other Brace: AFO Restrictions Weight Bearing Restrictions: No      Therapy/Group: Individual Therapy  Tajai Ihde 10/24/2018, 9:58 AM

## 2018-10-24 NOTE — Progress Notes (Signed)
Orthopedic Tech Progress Note Patient Details:  Alicia Valdez June 29, 1935 [id number]  Patient ID: [name], female   DOB: Apr 15, 1935, 83 y.o.   MRN: [id number]   [place] Pink 10/24/2018, 11:42 AMCalled [employer] for left toe up brace.

## 2018-10-24 NOTE — Progress Notes (Signed)
[employer] [facility] [employer] PHYSICAL MEDICINE & REHABILITATION PROGRESS NOTE   Subjective/Complaints:  No new complaints. Pleased with progress. Pain minimal  ROS: Patient denies fever, rash, sore throat, blurred vision, nausea, vomiting, diarrhea, cough, shortness of breath or chest pain, joint or back pain, headache, or mood change.   Objective:   [place] Lower Extremity Venous (dvt)  Result Date: 10/23/2018  Lower Venous Study Indications: Swelling, and Edema.  Comparison Study: no prior Performing Technologist: Blanch MediaMegan Riddle RVS  Examination Guidelines: A complete evaluation includes B-mode imaging, spectral Doppler, color Doppler, and power Doppler as needed of all accessible portions of each vessel. Bilateral testing is considered an integral part of a complete examination. Limited examinations for reoccurring indications may be performed as noted.  +---------+---------------+---------+-----------+----------+--------------+ RIGHT    CompressibilityPhasicitySpontaneityPropertiesThrombus Aging +---------+---------------+---------+-----------+----------+--------------+ CFV      Full           Yes      Yes                                 +---------+---------------+---------+-----------+----------+--------------+ SFJ      Full                                                        +---------+---------------+---------+-----------+----------+--------------+ FV Prox  Full                                                        +---------+---------------+---------+-----------+----------+--------------+ FV Mid   Full                                                        +---------+---------------+---------+-----------+----------+--------------+ FV DistalFull                                                        +---------+---------------+---------+-----------+----------+--------------+ PFV      Full                                                         +---------+---------------+---------+-----------+----------+--------------+ POP      Full           Yes      Yes                                 +---------+---------------+---------+-----------+----------+--------------+ PTV      Full                                                        +---------+---------------+---------+-----------+----------+--------------+  PERO     Full                                                        +---------+---------------+---------+-----------+----------+--------------+   +---------+---------------+---------+-----------+----------+--------------+ LEFT     CompressibilityPhasicitySpontaneityPropertiesThrombus Aging +---------+---------------+---------+-----------+----------+--------------+ CFV      Full           Yes      Yes                                 +---------+---------------+---------+-----------+----------+--------------+ SFJ      Full                                                        +---------+---------------+---------+-----------+----------+--------------+ FV Prox  Full                                                        +---------+---------------+---------+-----------+----------+--------------+ FV Mid   Full                                                        +---------+---------------+---------+-----------+----------+--------------+ FV DistalFull                                                        +---------+---------------+---------+-----------+----------+--------------+ PFV      Full                                                        +---------+---------------+---------+-----------+----------+--------------+ POP      Full           Yes      Yes                                 +---------+---------------+---------+-----------+----------+--------------+ PTV      Full                                                         +---------+---------------+---------+-----------+----------+--------------+ PERO  Not visualized +---------+---------------+---------+-----------+----------+--------------+     Summary: Right: There is no evidence of deep vein thrombosis in the lower extremity. No cystic structure found in the popliteal fossa. Left: There is no evidence of deep vein thrombosis in the lower extremity. No cystic structure found in the popliteal fossa.  *See table(s) above for measurements and observations. Electronically signed by Waverly Ferrari MD on 10/23/2018 at 12:41:15 PM.    Final    No results for input(s): WBC, HGB, HCT, PLT in the last 72 hours. Recent Labs    10/22/18 0551 10/24/18 0506  NA 134* 136  K 3.5 4.3  CL 96* 98  CO2 28 30  GLUCOSE 88 98  BUN 22 26*  CREATININE 1.09* 1.19*  CALCIUM 9.0 9.4    Intake/Output Summary (Last 24 hours) at 10/24/2018 1020 Last data filed at 10/24/2018 0803 Gross per 24 hour  Intake 480 ml  Output -  Net 480 ml     Physical Exam: Vital Signs Blood pressure (!) 143/75, pulse 75, temperature 98.3 F (36.8 C), temperature source Oral, resp. rate 19, SpO2 96 %. Constitutional: No distress . Vital signs reviewed. HEENT: EOMI, oral membranes moist Neck: supple Cardiovascular: RRR without murmur. No JVD    Respiratory: CTA Bilaterally without wheezes or rales. Normal effort    GI: BS +, non-tender, non-distended  Skin: Warm and dry.  Back incision CDI Psych: very pleasant Musc: Lower extremity edema trace Neurological: Alert Motor:  Right lower extremity for hip flexion, knee extension 4/5, ankle dorsiflexion 44/5,much improved Left lower extremity: Hip flexion, knee extension 4/5, ankle dorsiflexion 4/5  Assessment/Plan: 1. Functional deficits secondary to Ataxia, balance issues, due to CSF leak s/p reexploration which require 3+ hours per day of interdisciplinary therapy in a comprehensive  inpatient rehab setting.  Physiatrist is providing close team supervision and 24 hour management of active medical problems listed below.  Physiatrist and rehab team continue to assess barriers to discharge/monitor patient progress toward functional and medical goals  Care Tool:  Bathing    Body parts bathed by patient: Right arm, Left arm, Abdomen, Chest, Buttocks, Right upper leg, Left upper leg, Face   Body parts bathed by helper: Right lower leg, Left lower leg Body parts n/a: Front perineal area, Right lower leg, Left lower leg   Bathing assist Assist Level: Supervision/Verbal cueing     Upper Body Dressing/Undressing Upper body dressing   What is the patient wearing?: Pull over shirt    Upper body assist Assist Level: Supervision/Verbal cueing    Lower Body Dressing/Undressing Lower body dressing      What is the patient wearing?: Pants     Lower body assist Assist for lower body dressing: Supervision/Verbal cueing     Toileting Toileting    Toileting assist Assist for toileting: Contact Guard/Touching assist     Transfers Chair/bed transfer  Transfers assist     Chair/bed transfer assist level: Contact Guard/Touching assist     Locomotion Ambulation   Ambulation assist      Assist level: Contact Guard/Touching assist Assistive device: Walker-rolling Max distance: 86   Walk 10 feet activity   Assist     Assist level: Contact Guard/Touching assist Assistive device: Walker-rolling   Walk 50 feet activity   Assist Walk 50 feet with 2 turns activity did not occur: Safety/medical concerns  Assist level: Contact Guard/Touching assist Assistive device: Walker-rolling    Walk 150 feet activity   Assist Walk 150 feet activity did not occur: Safety/medical concerns  Walk 10 feet on uneven surface  activity   Assist Walk 10 feet on uneven surfaces activity did not occur: Safety/medical concerns          Wheelchair     Assist Will patient use wheelchair at discharge?: No      Wheelchair assist level: Supervision/Verbal cueing Max wheelchair distance: 150    Wheelchair 50 feet with 2 turns activity    Assist        Assist Level: Supervision/Verbal cueing   Wheelchair 150 feet activity     Assist      Assist Level: Supervision/Verbal cueing   Blood pressure (!) 143/75, pulse 75, temperature 98.3 F (36.8 C), temperature source Oral, resp. rate 19, SpO2 96 %.  Medical Problem List and Plan: 1.  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  Continue CIR therapies    2.  Antithrombotics: -DVT/anticoagulation:  Mechanical: Sequential compression devices, below knee Bilateral lower extremities              CBC within normal limits on 10/19    Ambulating regularly with therapy  -dopplers negative yesterday             -antiplatelet therapy: N/A 3. Pain Management: DC'd Celebrex as likely leading to regurgitation/acid reflux             Monitor with increased activity and upright posture. 4. Mood: LCSW to follow for evaluation and support.              -antipsychotic agents: NA 5. Neuropsych: This patient is capable of making decisions on her own behalf. 6. Skin/Wound Care:  Routine pressure relief measures.  7. Fluids/Electrolytes/Nutrition: Monitor I/O. Marland Kitchen 8.  Hypokalemia:   Potassium 3.5-10/21--continue low dose supplement 9.  AKI versus CKD:    Creatinine 1.03 on 10/16   10/19- Cr 1.36----1.09 10/21  -continue to push fluids 10. Severe GERD: Will schedule Protonix as IV BID. 11. HTN:     Hypotensive this week.  Likely volume driven  -reduced amlodipine 10/22  -continue to hold avapro/hctz given rise in bun/cr             -bp's look better today  -recheck bmet Sunday, consider resuming avapro/hctz then 12. Reactive depression: Feels fine at the moment but  has had a hard time past 6 months.  Resumed at lower dose and monitor. 13. Constipation with ileus: resolved    miralax daily  Diet advanced to soft diet   Reduce reglan to 5mg  tid ac 10/22  10/20--sorbitol with results    10/21--added senna-s at bedtime  Continue to adjust bowel meds as necessary 14.  Sleep disturbance  Trazodone DC'd  Patient already receiving Compazine and Benadryl  Improved  LOS: 9 days A FACE TO FACE EVALUATION WAS PERFORMED  Meredith Staggers 10/24/2018, 10:20 AM

## 2018-10-24 NOTE — Progress Notes (Signed)
Occupational Therapy Session Note  Patient Details  Name: Alicia Valdez MRN: [id number] Date of Birth: 05-08-1935  Today's Date: 10/24/2018 OT Individual Time: 1300-1400 OT Individual Time Calculation (min): 60 min    Short Term Goals: Week 2:  OT Short Term Goal 1 (Week 2): STG=LTG due to LOS  Skilled Therapeutic Interventions/Progress Updates:    Pt seen for OT session focusing on ADL re-training and functional transfers. Pt asleep in recliner upon arrival, easily awoken and agreeable to tx session, denying pain. Pt initially more lethargic at start of session, requiring min A for balance overall and increased cuing for safety.  Addressed pt's ability to don/doff foot-up brace independently. With VCs and demonstration for doffing technique, min A to doff. WOrked on pt donning brace while leaving brace attached to shoe and wrapping ankle wrap around. Able to complete with min A. WIll benefit from cont practice with task.  She ambulated into bathroom with min A using RW. Completed toileting task with steadying assist, on LOB episode when attempting clothing management, using wall and assist from therapist to regain balance.  Hand hygiene completed standing at sink, pt requiring use of forearm support on sink ledge despite VCs from therapist for upright posture.  Discussed pt's "bad" day vs "good day" and learning opportunities in regards to energy conservation and task set-up.  In ADL apartment, completed simulated shower stall transfer using RW. Following demonstration, pt able to return demonstrate with close supervision. Discussed set-up of shower chair to allow for easiest accessibility.  Completed sit<>stand drom low soft recliner. Mod A to stand from low surface.  Completed laundry task in pt laundry facility. Initially CGA, however, with fatigue ultimately requiring mod A for balance as pt attempting to remove items and not focusing on re-gaining balance. Returned to w/c and education  provided regarding energy conservation techniques during laundry task. Will benefit from cont practice. Pt returned to room at end of session, returned to recliner and left with all needs in reach.   Therapy Documentation Precautions:  Precautions Precautions: Back Required Braces or Orthoses: Other Brace Other Brace: AFO Restrictions Weight Bearing Restrictions: No Pain:     Therapy/Group: Individual Therapy  Krue Peterka L 10/24/2018, 7:14 AM

## 2018-10-25 MED ORDER — AMLODIPINE BESYLATE 10 MG PO TABS
10.0000 mg | ORAL_TABLET | Freq: Every day | ORAL | Status: DC
  Administered 2018-10-26 – 2018-10-28 (×3): 10 mg via ORAL
  Filled 2018-10-25 (×3): qty 1

## 2018-10-25 NOTE — Progress Notes (Signed)
[facility] PHYSICAL MEDICINE & REHABILITATION PROGRESS NOTE   Subjective/Complaints:  Had a great day yesterday. Walked 300'!   ROS: Patient denies fever, rash, sore throat, blurred vision, nausea, vomiting, diarrhea, cough, shortness of breath or chest pain, joint or back pain, headache, or mood change.    Objective:   [place] Lower Extremity Venous (dvt)  Result Date: 10/23/2018  Lower Venous Study Indications: Swelling, and Edema.  Comparison Study: no prior Performing Technologist: Blanch MediaMegan Riddle RVS  Examination Guidelines: A complete evaluation includes B-mode imaging, spectral Doppler, color Doppler, and power Doppler as needed of all accessible portions of each vessel. Bilateral testing is considered an integral part of a complete examination. Limited examinations for reoccurring indications may be performed as noted.  +---------+---------------+---------+-----------+----------+--------------+ RIGHT    CompressibilityPhasicitySpontaneityPropertiesThrombus Aging +---------+---------------+---------+-----------+----------+--------------+ CFV      Full           Yes      Yes                                 +---------+---------------+---------+-----------+----------+--------------+ SFJ      Full                                                        +---------+---------------+---------+-----------+----------+--------------+ FV Prox  Full                                                        +---------+---------------+---------+-----------+----------+--------------+ FV Mid   Full                                                        +---------+---------------+---------+-----------+----------+--------------+ FV DistalFull                                                        +---------+---------------+---------+-----------+----------+--------------+ PFV      Full                                                         +---------+---------------+---------+-----------+----------+--------------+ POP      Full           Yes      Yes                                 +---------+---------------+---------+-----------+----------+--------------+ PTV      Full                                                        +---------+---------------+---------+-----------+----------+--------------+  PERO     Full                                                        +---------+---------------+---------+-----------+----------+--------------+   +---------+---------------+---------+-----------+----------+--------------+ LEFT     CompressibilityPhasicitySpontaneityPropertiesThrombus Aging +---------+---------------+---------+-----------+----------+--------------+ CFV      Full           Yes      Yes                                 +---------+---------------+---------+-----------+----------+--------------+ SFJ      Full                                                        +---------+---------------+---------+-----------+----------+--------------+ FV Prox  Full                                                        +---------+---------------+---------+-----------+----------+--------------+ FV Mid   Full                                                        +---------+---------------+---------+-----------+----------+--------------+ FV DistalFull                                                        +---------+---------------+---------+-----------+----------+--------------+ PFV      Full                                                        +---------+---------------+---------+-----------+----------+--------------+ POP      Full           Yes      Yes                                 +---------+---------------+---------+-----------+----------+--------------+ PTV      Full                                                         +---------+---------------+---------+-----------+----------+--------------+ PERO  Not visualized +---------+---------------+---------+-----------+----------+--------------+     Summary: Right: There is no evidence of deep vein thrombosis in the lower extremity. No cystic structure found in the popliteal fossa. Left: There is no evidence of deep vein thrombosis in the lower extremity. No cystic structure found in the popliteal fossa.  *See table(s) above for measurements and observations. Electronically signed by Waverly Ferrari MD on 10/23/2018 at 12:41:15 PM.    Final    No results for input(s): WBC, HGB, HCT, PLT in the last 72 hours. Recent Labs    10/24/18 0506  NA 136  K 4.3  CL 98  CO2 30  GLUCOSE 98  BUN 26*  CREATININE 1.19*  CALCIUM 9.4    Intake/Output Summary (Last 24 hours) at 10/25/2018 0847 Last data filed at 10/24/2018 1813 Gross per 24 hour  Intake 322 ml  Output -  Net 322 ml     Physical Exam: Vital Signs Blood pressure (!) 143/73, pulse 77, temperature 98 F (36.7 C), temperature source Oral, resp. rate 18, SpO2 100 %. Constitutional: No distress . Vital signs reviewed. HEENT: EOMI, oral membranes moist Neck: supple Cardiovascular: RRR without murmur. No JVD    Respiratory: CTA Bilaterally without wheezes or rales. Normal effort    GI: BS +, non-tender, non-distended  Skin: Warm and dry.  Back incision is CDI Psych: pleasant Musc: Lower extremity edema trace only Neurological: Alert Motor:  Right lower extremity for hip flexion, knee extension 4/5, ankle dorsiflexion 44/5--stable Left lower extremity: Hip flexion, knee extension 4/5, ankle dorsiflexion 4/5  Assessment/Plan: 1. Functional deficits secondary to Ataxia, balance issues, due to CSF leak s/p reexploration which require 3+ hours per day of interdisciplinary therapy in a comprehensive inpatient rehab setting.  Physiatrist is providing  close team supervision and 24 hour management of active medical problems listed below.  Physiatrist and rehab team continue to assess barriers to discharge/monitor patient progress toward functional and medical goals  Care Tool:  Bathing    Body parts bathed by patient: Right arm, Left arm, Abdomen, Chest, Buttocks, Right upper leg, Left upper leg, Face   Body parts bathed by helper: Right lower leg, Left lower leg Body parts n/a: Front perineal area, Right lower leg, Left lower leg   Bathing assist Assist Level: Supervision/Verbal cueing     Upper Body Dressing/Undressing Upper body dressing   What is the patient wearing?: Pull over shirt    Upper body assist Assist Level: Supervision/Verbal cueing    Lower Body Dressing/Undressing Lower body dressing      What is the patient wearing?: Pants     Lower body assist Assist for lower body dressing: Supervision/Verbal cueing     Toileting Toileting    Toileting assist Assist for toileting: Contact Guard/Touching assist     Transfers Chair/bed transfer  Transfers assist     Chair/bed transfer assist level: Contact Guard/Touching assist     Locomotion Ambulation   Ambulation assist      Assist level: Contact Guard/Touching assist Assistive device: Walker-rolling Max distance: 120 feet   Walk 10 feet activity   Assist     Assist level: Contact Guard/Touching assist Assistive device: Walker-rolling   Walk 50 feet activity   Assist Walk 50 feet with 2 turns activity did not occur: Safety/medical concerns  Assist level: Contact Guard/Touching assist Assistive device: Walker-rolling    Walk 150 feet activity   Assist Walk 150 feet activity did not occur: Safety/medical concerns         Walk  10 feet on uneven surface  activity   Assist Walk 10 feet on uneven surfaces activity did not occur: Safety/medical concerns   Assist level: Contact Guard/Touching assist Assistive device:  Photographer Will patient use wheelchair at discharge?: No Type of Wheelchair: Manual    Wheelchair assist level: Supervision/Verbal cueing Max wheelchair distance: 50    Wheelchair 50 feet with 2 turns activity    Assist        Assist Level: Supervision/Verbal cueing   Wheelchair 150 feet activity     Assist      Assist Level: Supervision/Verbal cueing   Blood pressure (!) 143/73, pulse 77, temperature 98 F (36.7 C), temperature source Oral, resp. rate 18, SpO2 100 %.  Medical Problem List and Plan: 1.  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  Continue CIR therapies    2.  Antithrombotics: -DVT/anticoagulation:  Mechanical: Sequential compression devices, below knee Bilateral lower extremities              CBC within normal limits on 10/19    Ambulating regularly with therapy  -dopplers negative yesterday             -antiplatelet therapy: N/A 3. Pain Management: DC'd Celebrex as likely leading to regurgitation/acid reflux             Monitor with increased activity and upright posture. 4. Mood: LCSW to follow for evaluation and support.              -antipsychotic agents: NA 5. Neuropsych: This patient is capable of making decisions on her own behalf. 6. Skin/Wound Care:  Routine pressure relief measures.  7. Fluids/Electrolytes/Nutrition: Monitor I/O. Marland Kitchen 8.  Hypokalemia:   Potassium 3.5-10/21--continue low dose supplement 9.  AKI versus CKD:    Creatinine 1.03 on 10/16   10/19- Cr 1.36----1.09 10/21  -continue to push fluids 10. Severe GERD: Will schedule Protonix as IV BID. 11. HTN with hypotension likely d/t volume depletion   Hypotension better after holding/reducing meds  -SBP climbing  -increase amlodipine back to  tomorrow  -continue to hold avapro/hctz given rise in bun/cr             -recheck bmet Sunday  12.  Reactive depression: Feels fine at the moment but has had a hard time past 6 months.  Resumed at lower dose and monitor. 13. Constipation with ileus: resolved    miralax daily  Diet advanced to regular today  Reduced reglan to  tid ac 10/22---wean to off  10/20--sorbitol with results    10/21--added senna-s at bedtime  Moving bowels daily 14.  Sleep disturbance  Trazodone DC'd  Patient already receiving Compazine and Benadryl  Improved  LOS: 10 days A FACE TO FACE EVALUATION WAS PERFORMED  Ranelle Oyster 10/25/2018, 8:47 AM

## 2018-10-26 ENCOUNTER — Inpatient Hospital Stay (HOSPITAL_COMMUNITY): Payer: Medicare PPO | Admitting: Occupational Therapy

## 2018-10-26 ENCOUNTER — Inpatient Hospital Stay (HOSPITAL_COMMUNITY): Payer: Medicare PPO

## 2018-10-26 LAB — CBC
HCT: 37.4 % (ref 36.0–46.0)
Hemoglobin: 12.3 g/dL (ref 12.0–15.0)
MCH: 30.9 pg (ref 26.0–34.0)
MCHC: 32.9 g/dL (ref 30.0–36.0)
MCV: 94 fL (ref 80.0–100.0)
Platelets: 275 10*3/uL (ref 150–400)
RBC: 3.98 MIL/uL (ref 3.87–5.11)
RDW: 14 % (ref 11.5–15.5)
WBC: 3.5 10*3/uL — ABNORMAL LOW (ref 4.0–10.5)
nRBC: 0 % (ref 0.0–0.2)

## 2018-10-26 LAB — BASIC METABOLIC PANEL
Anion gap: 9 (ref 5–15)
BUN: 22 mg/dL (ref 8–23)
CO2: 28 mmol/L (ref 22–32)
Calcium: 8.9 mg/dL (ref 8.9–10.3)
Chloride: 102 mmol/L (ref 98–111)
Creatinine, Ser: 1.12 mg/dL — ABNORMAL HIGH (ref 0.44–1.00)
GFR calc Af Amer: 54 mL/min — ABNORMAL LOW (ref >60)
GFR calc non Af Amer: 46 mL/min — ABNORMAL LOW (ref >60)
Glucose, Bld: 94 mg/dL (ref 70–99)
Potassium: 4 mmol/L (ref 3.5–5.1)
Sodium: 139 mmol/L (ref 135–145)

## 2018-10-26 MED ORDER — METOCLOPRAMIDE HCL 5 MG PO TABS
5.0000 mg | ORAL_TABLET | Freq: Three times a day (TID) | ORAL | Status: DC
  Administered 2018-10-26 – 2018-10-28 (×8): 5 mg via ORAL
  Filled 2018-10-26 (×8): qty 1

## 2018-10-26 MED ORDER — HYDROCHLOROTHIAZIDE 12.5 MG PO CAPS: 12.5000 mg | ORAL_CAPSULE | Freq: Every day | ORAL | Status: DC

## 2018-10-26 MED ORDER — IRBESARTAN 75 MG PO TABS: 75.0000 mg | ORAL_TABLET | Freq: Every day | ORAL | Status: DC

## 2018-10-26 NOTE — Plan of Care (Signed)
  Problem: Consults Goal: RH SPINAL CORD INJURY PATIENT EDUCATION Description:  See Patient Education module for education specifics.  Outcome: Progressing Goal: Skin Care Protocol Initiated - if Braden Score 18 or less Description: If consults are not indicated, leave blank or document N/A Outcome: Progressing   Problem: SCI BOWEL ELIMINATION Goal: RH STG MANAGE BOWEL WITH ASSISTANCE Description: STG Manage Bowel with mod I Assistance. Outcome: Progressing Goal: RH STG SCI MANAGE BOWEL WITH MEDICATION WITH ASSISTANCE Description: STG SCI Manage bowel with medication with mod I assistance. Outcome: Progressing Goal: RH STG SCI MANAGE BOWEL PROGRAM W/ASSIST OR AS APPROPRIATE Description: STG SCI Manage bowel program w/ mod I assist or as appropriate. Outcome: Progressing   Problem: SCI BLADDER ELIMINATION Goal: RH STG SCI MANAGE BLADDER PROGRAM W/ASSISTANCE Description: Patient will verbalize understanding of bladder program including PVR checks and intermittent caths as needed to empty bladder. Outcome: Progressing   Problem: RH SKIN INTEGRITY Goal: RH STG SKIN FREE OF INFECTION/BREAKDOWN Description: Patients skin will remain free from further infection or breakdown with mod I assist. Outcome: Progressing Goal: RH STG MAINTAIN SKIN INTEGRITY WITH ASSISTANCE Description: STG Maintain Skin Integrity With mod I Assistance. Outcome: Progressing   Problem: RH PAIN MANAGEMENT Goal: RH STG PAIN MANAGED AT OR BELOW PT'S PAIN GOAL Description: < 3 Outcome: Progressing

## 2018-10-26 NOTE — Progress Notes (Signed)
Physical Therapy Session Note  Patient Details  Name: Alicia Valdez MRN: [id number] Date of Birth: 1935/06/03  Today's Date: 10/26/2018 PT Individual Time: 1001-1058 PT Individual Time Calculation (min): 57 min   Short Term Goals: Week 2:  PT Short Term Goal 1 (Week 2): =LTG due to ELOS  Skilled Therapeutic Interventions/Progress Updates:    Pt seated in w/c upon PT arrival, agreeable to therapy tx and denies pain. Pt propelled w/c from room>gym x 200 ft with supervision using B UEs. Pt ambulated x80 ft this session with RW and CGA, cues for foot clearance and upright posture. Pt transferred to mat CGA. Pt performed LE strengthening exercises this session as follows, 2 x 10 of each with cues for techniques/proper form: standing calf raises, standing mini squats without UE support, supine bridges, supine SLRs, sidelying hip abduction active assisted, sidelying clamshells. Bed mobility on mat with supervision this session. Seated edge of mat pt educated on importance on calf and hamstring stretching, therapist performed these stretches 2 x 30 sec per LE for ROM. Pt performed sidestepping this session within the parallel bars for hip strengthening 3 x 8 ft each direction, CGA. Pt ambulated x 160 ft this session working on gait and endurance, cues for upright posture, pt with chronic flexed gait pattern. Pt transported back to room in w/c, stand pivot to recliner CGA and left with needs in reach and chair alarm set.   Therapy Documentation Precautions:  Precautions Precautions: Back Required Braces or Orthoses: Other Brace Other Brace: AFO Restrictions Weight Bearing Restrictions: No    Therapy/Group: Individual Therapy  Netta Corrigan, PT, DPT, CSRS 10/26/2018, 8:01 AM

## 2018-10-26 NOTE — Progress Notes (Signed)
Occupational Therapy Session Note  Patient Details  Name: Alicia Valdez MRN: [id number] Date of Birth: 1935/11/17  Today's Date: 10/26/2018 OT Individual Time: 1450-1533 OT Individual Time Calculation (min): 43 min  17 minutes missed  Short Term Goals: Week 2:  OT Short Term Goal 1 (Week 2): STG=LTG due to LOS  Skilled Therapeutic Interventions/Progress Updates:    Pt greeted in recliner, reporting she had vomited after lunch and didn't feel particularly well. She didn't want anything medicinally to address, but agreeable to do some therapy. Very motivated to leave the unit for tx. Escorted her via w/c to the hospital atrium. In this community environment, pt ambulated with RW and supervision assist. Practiced booth transfers in the food court, navigating over carpet, sit<stands from low surfaces and also Chiropodist. She required vcs for walker mgt during transfers and proper positioning of DME when stopping to do something functional (I.e. read signs or historical writings, look at [employer]). She then requested to return to the room due to increased nausea. Pt was assisted back to the unit via w/c and transferred back to her recliner. Provided pt with an antinausea aromatherapy blend and emesis bag. Pt remained in the recliner, nursing her ginger ale. Call bell within reach. Time missed due to illness.   Therapy Documentation Precautions:  Precautions Precautions: Back Required Braces or Orthoses: Other Brace Other Brace: AFO Restrictions Weight Bearing Restrictions: No Vital Signs: Therapy Vitals Temp: 97.7 F (36.5 C) Temp Source: Oral Pulse Rate: 77 Resp: 20 BP: (!) 129/58 Patient Position (if appropriate): Sitting Oxygen Therapy SpO2: 97 % O2 Device: Room Air Pain: No c/o pain during tx   ADL: ADL Eating: Independent Where Assessed-Eating: Edge of bed Grooming: Supervision/safety Where Assessed-Grooming: Edge of bed Upper Body Bathing: Supervision/safety Where  Assessed-Upper Body Bathing: Edge of bed Lower Body Bathing: Moderate assistance Where Assessed-Lower Body Bathing: Edge of bed Upper Body Dressing: Supervision/safety Where Assessed-Upper Body Dressing: Edge of bed Lower Body Dressing: Maximal assistance Where Assessed-Lower Body Dressing: Edge of bed Toileting: Minimal assistance Where Assessed-Toileting: Bedside Commode Toilet Transfer: Moderate assistance Toilet Transfer Method: Insurance claims handler Equipment: Raised toilet seat      Therapy/Group: Individual Therapy  Selene Peltzer A Barrington Worley 10/26/2018, 4:17 PM

## 2018-10-26 NOTE — Progress Notes (Addendum)
[facility] PHYSICAL MEDICINE & REHABILITATION PROGRESS NOTE   Subjective/Complaints:  No new complaints. Had a good night. Happy that she's getting stronger  ROS: Patient denies fever, rash, sore throat, blurred vision, nausea, vomiting, diarrhea, cough, shortness of breath or chest pain, joint or back pain, headache, or mood change.   Objective:   No results found. Recent Labs    10/26/18 0538  WBC 3.5*  HGB 12.3  HCT 37.4  PLT 275   Recent Labs    10/24/18 0506 10/26/18 0538  NA 136 139  K 4.3 4.0  CL 98 102  CO2 30 28  GLUCOSE 98 94  BUN 26* 22  CREATININE 1.19* 1.12*  CALCIUM 9.4 8.9    Intake/Output Summary (Last 24 hours) at 10/26/2018 0830 Last data filed at 10/26/2018 0810 Gross per 24 hour  Intake 960 ml  Output -  Net 960 ml     Physical Exam: Vital Signs Blood pressure 124/70, pulse 83, temperature 98 F (36.7 C), temperature source Oral, resp. rate 19, SpO2 98 %. Constitutional: No distress . Vital signs reviewed. HEENT: EOMI, oral membranes moist Neck: supple Cardiovascular: RRR without murmur. No JVD    Respiratory: CTA Bilaterally without wheezes or rales. Normal effort    GI: BS +, non-tender, non-distended  Skin: Warm and dry.  Back incision is CDI Psych: pleasant Musc: Lower extremity edema trace Neurological: Alert Motor:  Right lower extremity for hip flexion, knee extension 4/5, ankle dorsiflexion 44/5--stable.improved Left lower extremity: Hip flexion, knee extension 4/5, ankle dorsiflexion 4/5  Assessment/Plan: 1. Functional deficits secondary to Ataxia, balance issues, due to CSF leak s/p reexploration which require 3+ hours per day of interdisciplinary therapy in a comprehensive inpatient rehab setting.  Physiatrist is providing close team supervision and 24 hour management of active medical problems listed below.  Physiatrist and rehab team continue to assess barriers to discharge/monitor patient progress toward functional  and medical goals  Care Tool:  Bathing    Body parts bathed by patient: Right arm, Left arm, Abdomen, Chest, Buttocks, Right upper leg, Left upper leg, Face   Body parts bathed by helper: Right lower leg, Left lower leg Body parts n/a: Front perineal area, Right lower leg, Left lower leg   Bathing assist Assist Level: Supervision/Verbal cueing     Upper Body Dressing/Undressing Upper body dressing   What is the patient wearing?: Pull over shirt    Upper body assist Assist Level: Supervision/Verbal cueing    Lower Body Dressing/Undressing Lower body dressing      What is the patient wearing?: Pants     Lower body assist Assist for lower body dressing: Supervision/Verbal cueing     Toileting Toileting    Toileting assist Assist for toileting: Contact Guard/Touching assist     Transfers Chair/bed transfer  Transfers assist     Chair/bed transfer assist level: Contact Guard/Touching assist     Locomotion Ambulation   Ambulation assist      Assist level: Contact Guard/Touching assist Assistive device: Walker-rolling Max distance: 120 feet   Walk 10 feet activity   Assist     Assist level: Contact Guard/Touching assist Assistive device: Walker-rolling   Walk 50 feet activity   Assist Walk 50 feet with 2 turns activity did not occur: Safety/medical concerns  Assist level: Contact Guard/Touching assist Assistive device: Walker-rolling    Walk 150 feet activity   Assist Walk 150 feet activity did not occur: Safety/medical concerns         Walk  10 feet on uneven surface  activity   Assist Walk 10 feet on uneven surfaces activity did not occur: Safety/medical concerns   Assist level: Contact Guard/Touching assist Assistive device: Aeronautical engineer Will patient use wheelchair at discharge?: No Type of Wheelchair: Manual    Wheelchair assist level: Supervision/Verbal cueing Max wheelchair distance: 50     Wheelchair 50 feet with 2 turns activity    Assist        Assist Level: Supervision/Verbal cueing   Wheelchair 150 feet activity     Assist      Assist Level: Supervision/Verbal cueing   Blood pressure 124/70, pulse 83, temperature 98 F (36.7 C), temperature source Oral, resp. rate 19, SpO2 98 %.  Medical Problem List and Plan: 1.  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  Continue CIR therapies    2.  Antithrombotics: -DVT/anticoagulation:  Mechanical: Sequential compression devices, below knee Bilateral lower extremities              CBC within normal limits on 10/25    Ambulating regularly with therapy  -dopplers negative yesterday             -antiplatelet therapy: N/A 3. Pain Management: DC'd Celebrex as likely leading to regurgitation/acid reflux             Monitor with increased activity and upright posture. 4. Mood: LCSW to follow for evaluation and support.              -antipsychotic agents: NA 5. Neuropsych: This patient is capable of making decisions on her own behalf. 6. Skin/Wound Care:  Routine pressure relief measures.  7. Fluids/Electrolytes/Nutrition: Monitor I/O. Marland Kitchen 8.  Hypokalemia:   Potassium 4.0 10/25, continue supp 9.  AKI versus CKD:    Creatinine 1.03 on 10/16   10/19- Cr 1.36----1.09 10/21-->1.12 10/25  -continue to push fluids 10. Severe GERD: protonix. 11. HTN with hypotension likely d/t volume depletion   Hypotension better after holding/reducing meds  -SBP climbing  -increased amlodipine back to 5mg  beginning this morning 10/25  -continue to hold avapro/hctz given previous rise in bun/cr                12. Reactive depression: Feels fine at the moment but has had a hard time past 6 months.  Resumed at lower dose and monitor. 13. Constipation with ileus: resolved    miralax daily  Diet advanced to regular  today  Reducedreglan to 5mg  tid ac today 10/25  10/20--sorbitol with results    10/21--added senna-s at bedtime  Moving bowels daily 14.  Sleep disturbance  Trazodone DC'd  Patient already receiving Compazine and Benadryl  Improved  LOS: 11 days A FACE TO FACE EVALUATION WAS PERFORMED  Meredith Staggers 10/26/2018, 8:30 AM

## 2018-10-27 ENCOUNTER — Inpatient Hospital Stay (HOSPITAL_COMMUNITY): Payer: Medicare PPO | Admitting: Physical Therapy

## 2018-10-27 ENCOUNTER — Inpatient Hospital Stay (HOSPITAL_COMMUNITY): Payer: Medicare PPO | Admitting: Occupational Therapy

## 2018-10-27 MED ORDER — POLYETHYLENE GLYCOL 3350 17 G PO PACK: 17.0000 g | PACK | Freq: Two times a day (BID) | ORAL | 0 refills | Status: AC

## 2018-10-27 MED ORDER — AMLODIPINE BESYLATE 10 MG PO TABS: 10.0000 mg | ORAL_TABLET | Freq: Every day | ORAL | 0 refills | Status: AC

## 2018-10-27 MED ORDER — CYCLOBENZAPRINE HCL 10 MG PO TABS: 10.0000 mg | ORAL_TABLET | Freq: Three times a day (TID) | ORAL | 0 refills | Status: AC | PRN

## 2018-10-27 MED ORDER — VALSARTAN-HYDROCHLOROTHIAZIDE 80-12.5 MG PO TABS: 1.0000 | ORAL_TABLET | Freq: Every day | ORAL | 0 refills | Status: AC

## 2018-10-27 MED ORDER — OXYCODONE HCL 5 MG PO TABS: 5.0000 mg | ORAL_TABLET | ORAL | 0 refills | Status: AC | PRN

## 2018-10-27 MED ORDER — ACETAMINOPHEN 325 MG PO TABS: 325.0000 mg | ORAL_TABLET | ORAL | Status: AC | PRN

## 2018-10-27 MED ORDER — METOCLOPRAMIDE HCL 5 MG PO TABS: 5.0000 mg | ORAL_TABLET | Freq: Three times a day (TID) | ORAL | 0 refills | Status: AC

## 2018-10-27 MED ORDER — LORATADINE 10 MG PO TABS: 10.0000 mg | ORAL_TABLET | Freq: Every day | ORAL | 0 refills | Status: AC

## 2018-10-27 MED ORDER — ESOMEPRAZOLE MAGNESIUM 20 MG PO CPDR: 20.0000 mg | DELAYED_RELEASE_CAPSULE | Freq: Every day | ORAL | 0 refills | Status: AC

## 2018-10-27 MED ORDER — DESMOPRESSIN ACETATE 0.1 MG PO TABS: 0.1000 mg | ORAL_TABLET | Freq: Every day | ORAL | 0 refills | Status: AC

## 2018-10-27 MED ORDER — BUTALBITAL-APAP-CAFFEINE 50-325-40 MG PO CAPS: 1.0000 | ORAL_CAPSULE | Freq: Two times a day (BID) | ORAL | 0 refills | Status: AC

## 2018-10-27 MED ORDER — VENLAFAXINE HCL ER 75 MG PO CP24: 75.0000 mg | ORAL_CAPSULE | Freq: Every day | ORAL | 0 refills | Status: AC

## 2018-10-27 NOTE — Progress Notes (Signed)
Physical Therapy Discharge Summary  Patient Details  Name: Alicia Valdez MRN: [id number] Date of Birth: 05/24/1935  Today's Date: 10/27/2018 PT Individual Time: 8309-4076 PT Individual Time Calculation (min): 45 min   Patient has met 9 of 9 long term goals due to improved activity tolerance, improved balance, improved postural control, increased strength and ability to compensate for deficits.  Patient to discharge at an ambulatory level Supervision to mod I.   Patient's care partner is independent to provide the necessary physical assistance at discharge.  Reasons goals not met: Patient has met all rehab goals.  Recommendation:  Patient will benefit from ongoing skilled PT services in home health setting to continue to advance safe functional mobility, address ongoing impairments in endurance, balance, strength, safety, independence with functional mobility, and minimize fall risk.  Equipment: RW  Reasons for discharge: treatment goals met and discharge from hospital  Patient/family agrees with progress made and goals achieved: Yes   Skilled Intervention: Pt received seated in w/c in room, agreeable to PT session. No complaints of pain. Pt's husband present for continued hands on family education session. Pt requests to perform mobility in functional environment, ambulation x 200 ft with RW navigating around gift shop and seats in atrium with RW at mod I level. Pt is able to navigate busy environments with multiple obstacles well and overall demonstrates good safety awareness. Pt is ok to be mod I in her room, education about making sure environment is clear of obstacles before attempting ambulation with RW. Pt and her husband in agreement with education and are ready for a safe d/c home tomorrow.  PT Discharge Precautions/Restrictions Precautions Precautions: Back Precaution Comments: reviewed all precautions; pt verbalized understanding, but noted some difficulty putting precautions  into practice with functional mobility Required Braces or Orthoses: Other Brace Other Brace: Foot-up brace Restrictions Weight Bearing Restrictions: No Vision/Perception  Perception Perception: Within Functional Limits Praxis Praxis: Intact  Cognition Overall Cognitive Status: Within Functional Limits for tasks assessed Arousal/Alertness: Awake/alert Orientation Level: Oriented X4 Attention: Sustained Sustained Attention: Appears intact Memory: Appears intact Awareness: Appears intact Problem Solving: Appears intact Safety/Judgment: Appears intact Sensation Sensation Light Touch: Impaired Detail Light Touch Impaired Details: Impaired RLE;Impaired LLE Proprioception: Impaired Detail Proprioception Impaired Details: Impaired RLE;Impaired LLE Coordination Gross Motor Movements are Fluid and Coordinated: No Fine Motor Movements are Fluid and Coordinated: Yes Coordination and Movement Description: Impaired 2/2 generalized weakness/deconditioning in LEs Motor  Motor Motor: Other (comment) Motor - Skilled Clinical Observations: see LE assessment for strength deficits bilat LE's, UEs WFL Motor - Discharge Observations: Generalized weakness/deconditioning  Mobility Bed Mobility Bed Mobility: Rolling Right;Rolling Left;Supine to Sit;Sit to Supine Rolling Right: Independent Rolling Left: Independent Right Sidelying to Sit: Independent Supine to Sit: Independent Sitting - Scoot to Edge of Bed: Independent Sit to Supine: Independent Sit to Sidelying Right: Independent Transfers Transfers: Sit to Stand;Stand to [employer] Transfers Sit to Stand: Independent with assistive device Stand to Sit: Independent with assistive device Stand Pivot Transfers: Independent with assistive device Locomotion  Gait Ambulation: Yes Gait Assistance: Independent with assistive device Gait Distance (Feet): 150 Feet Assistive device: Rolling walker Gait Gait: Yes Gait Pattern:  Impaired Gait Pattern: Decreased step length - right;Decreased step length - left;Decreased dorsiflexion - left;Right flexed knee in stance;Trunk flexed;Wide base of support Gait velocity: decreased Stairs / Additional Locomotion Stairs: Yes Stairs Assistance: Contact Guard/Touching assist Stair Management Technique: Two rails Number of Stairs: 4 Height of Stairs: 6 Wheelchair Mobility Wheelchair Mobility: Yes Wheelchair Assistance: Independent with  assistive device Wheelchair Propulsion: Both upper extremities Wheelchair Parts Management: Independent Distance: 150  Trunk/Postural Assessment  Cervical Assessment Cervical Assessment: Exceptions to WFL(forward head) Thoracic Assessment Thoracic Assessment: Exceptions to WFL(kyphotic; rounded shoulders) Lumbar Assessment Lumbar Assessment: Exceptions to WFL(posterior pelvic tilt) Postural Control Postural Control: Deficits on evaluation Righting Reactions: decreased Postural Limitations: flexed posture  Balance Balance Balance Assessed: Yes Static Sitting Balance Static Sitting - Balance Support: Feet supported;No upper extremity supported Static Sitting - Level of Assistance: 7: Independent Dynamic Sitting Balance Dynamic Sitting - Balance Support: During functional activity;Feet supported Dynamic Sitting - Level of Assistance: 6: Modified independent (Device/Increase time);5: Stand by assistance Sitting balance - Comments: Sitting to complete bathing task Static Standing Balance Static Standing - Balance Support: During functional activity;No upper extremity supported Static Standing - Level of Assistance: 6: Modified independent (Device/Increase time);5: Stand by assistance Dynamic Standing Balance Dynamic Standing - Balance Support: During functional activity;Right upper extremity supported;Left upper extremity supported Dynamic Standing - Level of Assistance: 6: Modified independent (Device/Increase time);5: Stand by  assistance Dynamic Standing - Comments: Standing at RW to complete LB clothing management/toileting task Extremity Assessment  RUE Assessment RUE Assessment: Within Functional Limits LUE Assessment LUE Assessment: Within Functional Limits RLE Assessment RLE Assessment: Exceptions to [facility] General Strength Comments: grossly 3/5 LLE Assessment LLE Assessment: Exceptions to [facility] General Strength Comments: grossly 3/5     Excell Seltzer, PT, DPT  10/27/2018, 3:52 PM

## 2018-10-27 NOTE — Progress Notes (Signed)
Occupational Therapy Session Note  Patient Details  Name: Dalonda Simoni MRN: [id number] Date of Birth: 1935-04-16  Today's Date: 10/27/2018 OT Individual Time: 0705-0800 OT Individual Time Calculation (min): 55 min    Short Term Goals: Week 2:  OT Short Term Goal 1 (Week 2): STG=LTG due to LOS  Skilled Therapeutic Interventions/Progress Updates:    Upon entering the room, pt seated in wheelchair at sink performing grooming tasks. Pt declined bathing tasks but donning clothing items with sit <>stand and use of AE as needed with overall supervision. Pt needing min cuing to maintain back precautions (twisting) during self care tasks. Pt able to utilize figure four position and shoe buttons to increase I in self care tasks. Pt transitioned into recliner chair with supervision and use of RW. Chair alarm activated and call bell within reach. OT answered questions pt had regarding discharge plans for the morning. No further questions at this time and RN arrived with medications.   Therapy Documentation Precautions:  Precautions Precautions: Back Required Braces or Orthoses: Other Brace Other Brace: AFO Restrictions Weight Bearing Restrictions: No Pain: Pain Assessment Pain Scale: 0-10 Pain Score: 0-No pain ADL: ADL Eating: Independent Where Assessed-Eating: Edge of bed Grooming: Supervision/safety Where Assessed-Grooming: Edge of bed Upper Body Bathing: Supervision/safety Where Assessed-Upper Body Bathing: Edge of bed Lower Body Bathing: Moderate assistance Where Assessed-Lower Body Bathing: Edge of bed Upper Body Dressing: Supervision/safety Where Assessed-Upper Body Dressing: Edge of bed Lower Body Dressing: Maximal assistance Where Assessed-Lower Body Dressing: Edge of bed Toileting: Minimal assistance Where Assessed-Toileting: Bedside Commode Toilet Transfer: Moderate assistance Toilet Transfer Method: Insurance claims handler Equipment: Raised toilet  seat   Therapy/Group: Individual Therapy  Gypsy Decant 10/27/2018, 9:03 AM

## 2018-10-27 NOTE — Discharge Instructions (Signed)
Inpatient Rehab Discharge Instructions  Alicia Valdez Discharge date and time:    Activities/Precautions/ Functional Status: Activity: no lifting, driving, or strenuous exercise for till cleared by MD Diet: regular diet Wound Care: keep wound clean and dry Contact MD if you develop any problems with your incision/wound--redness, swelling, increase in pain, drainage or if you develop fever or chills.   Functional status:  ___ No restrictions     ___ Walk up steps independently ___ 24/7 supervision/assistance   ___ Walk up steps with assistance ___ Intermittent supervision/assistance  ___ Bathe/dress independently ___ Walk with walker     ___ Bathe/dress with assistance ___ Walk Independently    ___ Shower independently ___ Walk with assistance    ___ Shower with assistance ___ No alcohol     ___ Return to work/school ________    COMMUNITY REFERRALS UPON DISCHARGE:    Home Health:   PT     OT                     Agency:  [facility]    Phone: [phone]   Medical Equipment/Items Ordered:  Gilford Rile, commode                                                      Agency/Supplier:  [facility] @ [phone]      Special Instructions:    My questions have been answered and I understand these instructions. I will adhere to these goals and the provided educational materials after my discharge from the hospital.  Patient/Caregiver Signature _______________________________ Date __________  Clinician Signature _______________________________________ Date __________  Please bring this form and your medication list with you to all your follow-up doctor's appointments.

## 2018-10-27 NOTE — Progress Notes (Signed)
Occupational Therapy Session Note  Patient Details  Name: Chriss Redel MRN: [id number] Date of Birth: 1935/01/20  Today's Date: 10/27/2018 OT Individual Time: 5726-2035 OT Individual Time Calculation (min): 79 min    Short Term Goals: Week 2:  OT Short Term Goal 1 (Week 2): STG=LTG due to LOS  Skilled Therapeutic Interventions/Progress Updates:    Pt seen for OT session focusing on caregiver education and ADL re-training. Pt sitting up in recliner upon arrival,  Agreeable to tx session and denying pain. Addressed donning/doffing techniques of L foot-up brace. Following several problem solving trials, able to come up with way for pt to donn/doff brace independently, leaving shoe buckle fastened and only removing and placing brace by velcro ankle straps.  She ambulated throughout session using RW with supervision, occasional cuing required for proper management of RW in functional context.  In ADL apartment, completed simulated shower stall transfer using RW. Pt able to recall technique for transfer taught in previous sessions and completed transfer with supervision. Sit<>stand from low soft surface couch with supervision.  Pt ambulated back to room at end of session, left seated in recliner with all needs in reach and husband present.  Husband present for educational portion of session. He was understanding of all recommendations made. Education provided regarding energy conservation techniques, safety awareness with RW, use of 3:1BSC and recommended use, continuum of care and d/c planning.   Therapy Documentation Precautions:  Precautions Precautions: Back Required Braces or Orthoses: Other Brace Other Brace: AFO Restrictions Weight Bearing Restrictions: No   Therapy/Group: Individual Therapy  Sanjit Mcmichael L 10/27/2018, 7:04 AM

## 2018-10-27 NOTE — Progress Notes (Signed)
[facility] PHYSICAL MEDICINE & REHABILITATION PROGRESS NOTE   Subjective/Complaints:  Wondering about dressing changes on her low back- lives near [place], [place] and wondering if needs to come back for f/u- explained she will need to see surgeon, but not [place], only if she wants to.  .    ROS: Patient denies fever, rash, sore throat, blurred vision, nausea, vomiting, diarrhea, cough, shortness of breath or chest pain, joint or back pain, headache, or mood change.   Objective:   No results found. Recent Labs    10/26/18 0538  WBC 3.5*  HGB 12.3  HCT 37.4  PLT 275   Recent Labs    10/26/18 0538  NA 139  K 4.0  CL 102  CO2 28  GLUCOSE 94  BUN 22  CREATININE 1.12*  CALCIUM 8.9    Intake/Output Summary (Last 24 hours) at 10/27/2018 1132 Last data filed at 10/27/2018 0715 Gross per 24 hour  Intake 700 ml  Output -  Net 700 ml     Physical Exam: Vital Signs Blood pressure 135/69, pulse 83, temperature 98 F (36.7 C), temperature source Oral, resp. rate 16, SpO2 96 %. Constitutional: No distress . Vital signs reviewed. Awake, alert, in manual w/c, could stand on own with RW, NAD HEENT: EOMI, oral membranes moist Neck: supple Cardiovascular: RRR without murmur. No JVD    Respiratory: CTA Bilaterally without wheezes or rales. Normal effort    GI: BS +, non-tender, non-distended  Skin: Warm and dry.  Back incision is good except long scab over incision- dressing is there to protect scab Psych: pleasant Musc: Lower extremity edema trace Neurological: Alert Motor:  Right lower extremity for hip flexion, knee extension 4/5, ankle dorsiflexion 44/5--stable.improved Left lower extremity: Hip flexion, knee extension 4/5, ankle dorsiflexion 4/5  Assessment/Plan: 1. Functional deficits secondary to Ataxia, balance issues, due to CSF leak s/p reexploration which require 3+ hours per day of interdisciplinary therapy in a comprehensive inpatient rehab setting.  Physiatrist is  providing close team supervision and 24 hour management of active medical problems listed below.  Physiatrist and rehab team continue to assess barriers to discharge/monitor patient progress toward functional and medical goals  Care Tool:  Bathing    Body parts bathed by patient: Right arm, Left arm, Abdomen, Chest, Buttocks, Right upper leg, Left upper leg, Face, Right lower leg, Left lower leg, Front perineal area   Body parts bathed by helper: Right lower leg, Left lower leg Body parts n/a: Front perineal area, Right lower leg, Left lower leg   Bathing assist Assist Level: Supervision/Verbal cueing     Upper Body Dressing/Undressing Upper body dressing   What is the patient wearing?: Pull over shirt    Upper body assist Assist Level: Independent    Lower Body Dressing/Undressing Lower body dressing      What is the patient wearing?: Pants     Lower body assist Assist for lower body dressing: Supervision/Verbal cueing     Toileting Toileting    Toileting assist Assist for toileting: Supervision/Verbal cueing     Transfers Chair/bed transfer  Transfers assist     Chair/bed transfer assist level: Supervision/Verbal cueing     Locomotion Ambulation   Ambulation assist      Assist level: Contact Guard/Touching assist Assistive device: Walker-rolling Max distance: 80 ft   Walk 10 feet activity   Assist     Assist level: Contact Guard/Touching assist Assistive device: Walker-rolling   Walk 50 feet activity   Assist Walk 50  feet with 2 turns activity did not occur: Safety/medical concerns  Assist level: Contact Guard/Touching assist Assistive device: Walker-rolling    Walk 150 feet activity   Assist Walk 150 feet activity did not occur: Safety/medical concerns         Walk 10 feet on uneven surface  activity   Assist Walk 10 feet on uneven surfaces activity did not occur: Safety/medical concerns   Assist level: Contact  Guard/Touching assist Assistive device: Photographer Will patient use wheelchair at discharge?: No Type of Wheelchair: Manual    Wheelchair assist level: Supervision/Verbal cueing Max wheelchair distance: 50    Wheelchair 50 feet with 2 turns activity    Assist        Assist Level: Supervision/Verbal cueing   Wheelchair 150 feet activity     Assist      Assist Level: Supervision/Verbal cueing   Blood pressure 135/69, pulse 83, temperature 98 F (36.7 C), temperature source Oral, resp. rate 16, SpO2 96 %.  Medical Problem List and Plan: 1.  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  Continue CIR therapies   10/26- can do dressing if needed, to cover scab from catching on clothing 2.  Antithrombotics: -DVT/anticoagulation:  Mechanical: Sequential compression devices, below knee Bilateral lower extremities              CBC within normal limits on 10/25    Ambulating regularly with therapy  -dopplers negative yesterday             -antiplatelet therapy: N/A 3. Pain Management: DC'd Celebrex as likely leading to regurgitation/acid reflux             Monitor with increased activity and upright posture. 4. Mood: LCSW to follow for evaluation and support.              -antipsychotic agents: NA 5. Neuropsych: This patient is capable of making decisions on her own behalf. 6. Skin/Wound Care:  Routine pressure relief measures.  7. Fluids/Electrolytes/Nutrition: Monitor I/O. Marland Kitchen 8.  Hypokalemia:   Potassium 4.0 10/25, continue supp 9.  AKI versus CKD:    Creatinine 1.03 on 10/16   10/19- Cr 1.36----1.09 10/21-->1.12 10/25  -continue to push fluids 10. Severe GERD: protonix. 11. HTN with hypotension likely d/t volume depletion   Hypotension better after holding/reducing meds  -SBP climbing  -increased amlodipine back to 5mg  beginning this  morning 10/25  -continue to hold avapro/hctz given previous rise in bun/cr                12. Reactive depression: Feels fine at the moment but has had a hard time past 6 months.  Resumed at lower dose and monitor. 13. Constipation with ileus: resolved    miralax daily  Diet advanced to regular today  Reducedreglan to 5mg  tid ac today 10/25  10/20--sorbitol with results    10/21--added senna-s at bedtime  Moving bowels daily 14.  Sleep disturbance  Trazodone DC'd  Patient already receiving Compazine and Benadryl  Improved  LOS: 12 days A FACE TO FACE EVALUATION WAS PERFORMED  Turhan Chill 10/27/2018, 11:32 AM

## 2018-10-27 NOTE — Progress Notes (Signed)
Physical Therapy Session Note  Patient Details  Name: Alicia Valdez MRN: [id number] Date of Birth: August 29, 1935  Today's Date: 10/27/2018 PT Individual Time: 1110-1150 PT Individual Time Calculation (min): 40 min   Short Term Goals: Week 2:  PT Short Term Goal 1 (Week 2): =LTG due to ELOS  Skilled Therapeutic Interventions/Progress Updates:   Pt in recliner and agreeable to therapy, no c/o pain. Husband present for caregiver education. Husband w/ his own physical impairments, only able to provide supervision level assist. Educated husband on supervision-level assist for bed mobility, gait w/ RW, transfers, and car transfer. Discussed log-roll technique for bed mobility, back precautions, and cues pt needs for safety especially w/ dual task and general safety awareness when moving around the home. Discussed energy conservation strategies and fall prevention tips for at home including keeping the house de-cluttered, removing small area rugs, and keeping commonly-used items at arm's reach/waist-level. Both pt and husband verbalized understanding and in agreement. Additionally practiced stair negotiation, 4 steps w/ B rails and CGA. Discussed that going to 2nd floor at home would not be safe at this time, both in agreement. Returned to room and pt ended session in recliner, all needs in reach.   Therapy Documentation Precautions:  Precautions Precautions: Back Required Braces or Orthoses: Other Brace Other Brace: AFO Restrictions Weight Bearing Restrictions: No  Therapy/Group: Individual Therapy  Fleetwood Pierron Clent Demark 10/27/2018, 12:25 PM

## 2018-10-27 NOTE — Progress Notes (Signed)
Occupational Therapy Discharge Summary  Patient Details  Name: Alicia Valdez MRN: [id number] Date of Birth: October 30, 1935   Patient has met 10 of 10 long term goals due to improved activity tolerance, improved balance, postural control and improved coordination.  Patient to discharge at overall Supervision level.  Patient's care partner is independent to provide the necessary physical assistance at discharge.  Pt's husband is able to provide supervision level assistance at d/c. He was present for educational session focusing on pt's CLOF, safety awareness needs, mobility and transfer techniques, etc. He voices understanding with all recommendations made.  Pt uses RW for mobility and AE for LB dressing tasks. Pt and husband voice understanding and agreement with recommendations made.   Recommendation:  Patient will benefit from ongoing skilled OT services in home health setting to continue to advance functional skills in the area of BADL, iADL and Reduce care partner burden.  Equipment: BSC, pt has shower chair  Reasons for discharge: treatment goals met and discharge from hospital  Patient/family agrees with progress made and goals achieved: Yes  OT Discharge Precautions/Restrictions  Precautions Precautions: Back Required Braces or Orthoses: Other Brace Other Brace: Foot-up brace Restrictions Weight Bearing Restrictions: No Vision Baseline Vision/History: Wears glasses Wears Glasses: Reading only Patient Visual Report: No change from baseline Vision Assessment?: No apparent visual deficits Perception  Perception: Within Functional Limits Praxis Praxis: Intact Cognition Overall Cognitive Status: Within Functional Limits for tasks assessed Arousal/Alertness: Awake/alert Orientation Level: Oriented X4 Sustained Attention: Appears intact Memory: Appears intact Awareness: Appears intact Problem Solving: Appears intact Safety/Judgment: Appears intact Sensation Sensation Light  Touch: Appears Intact Proprioception: Impaired Detail Proprioception Impaired Details: Impaired RLE;Impaired LLE Coordination Gross Motor Movements are Fluid and Coordinated: No Fine Motor Movements are Fluid and Coordinated: Yes Coordination and Movement Description: Impaired 2/2 generalized weakness/deconditioning in LEs Motor  Motor Motor: Other (comment) Motor - Discharge Observations: Generalized weakness/deconditioning Trunk/Postural Assessment  Cervical Assessment Cervical Assessment: Exceptions to WFL(Forward head) Thoracic Assessment Thoracic Assessment: Exceptions to WFL(Kyphotic; Rounded shoulders) Lumbar Assessment Lumbar Assessment: Exceptions to WFL(Posterior pelvic tilt) Postural Control Postural Control: Deficits on evaluation Postural Limitations: Flexed posture  Balance Balance Balance Assessed: Yes Static Sitting Balance Static Sitting - Balance Support: Feet supported;No upper extremity supported Static Sitting - Level of Assistance: 7: Independent Dynamic Sitting Balance Dynamic Sitting - Balance Support: During functional activity;Feet supported Dynamic Sitting - Level of Assistance: 6: Modified independent (Device/Increase time);5: Stand by assistance Sitting balance - Comments: Sitting to complete bathing task Static Standing Balance Static Standing - Balance Support: During functional activity;No upper extremity supported Static Standing - Level of Assistance: 5: Stand by assistance Dynamic Standing Balance Dynamic Standing - Balance Support: During functional activity;Right upper extremity supported;Left upper extremity supported Dynamic Standing - Level of Assistance: 5: Stand by assistance Dynamic Standing - Comments: Standing at RW to complete LB clothing management/toileting task Extremity/Trunk Assessment RUE Assessment RUE Assessment: Within Functional Limits LUE Assessment LUE Assessment: Within Functional Limits   Mohd Clemons  L 10/27/2018, 12:41 PM

## 2018-10-28 NOTE — Plan of Care (Signed)
  Problem: Consults Goal: RH SPINAL CORD INJURY PATIENT EDUCATION Description:  See Patient Education module for education specifics.  Outcome: Completed/Met Goal: Skin Care Protocol Initiated - if Braden Score 18 or less Description: If consults are not indicated, leave blank or document N/A Outcome: Completed/Met   Problem: SCI BOWEL ELIMINATION Goal: RH STG MANAGE BOWEL WITH ASSISTANCE Description: STG Manage Bowel with mod I Assistance. Outcome: Completed/Met Goal: RH STG SCI MANAGE BOWEL WITH MEDICATION WITH ASSISTANCE Description: STG SCI Manage bowel with medication with mod I assistance. Outcome: Completed/Met Goal: RH STG SCI MANAGE BOWEL PROGRAM W/ASSIST OR AS APPROPRIATE Description: STG SCI Manage bowel program w/ mod I assist or as appropriate. Outcome: Completed/Met   Problem: SCI BLADDER ELIMINATION Goal: RH STG SCI MANAGE BLADDER PROGRAM W/ASSISTANCE Description: Patient will verbalize understanding of bladder program including PVR checks and intermittent caths as needed to empty bladder. Outcome: Completed/Met   Problem: RH SKIN INTEGRITY Goal: RH STG SKIN FREE OF INFECTION/BREAKDOWN Description: Patients skin will remain free from further infection or breakdown with mod I assist. Outcome: Completed/Met Goal: RH STG MAINTAIN SKIN INTEGRITY WITH ASSISTANCE Description: STG Maintain Skin Integrity With mod I Assistance. Outcome: Completed/Met   Problem: RH PAIN MANAGEMENT Goal: RH STG PAIN MANAGED AT OR BELOW PT'S PAIN GOAL Description: < 3 Outcome: Completed/Met

## 2018-10-28 NOTE — Progress Notes (Signed)
[facility] PHYSICAL MEDICINE & REHABILITATION PROGRESS NOTE   Subjective/Complaints:   Pt wants to f/u with her PCP will call us if she needs [place].  Plan for d/c today- her husband on the way.  ROS: Patient denies fever, rash, sore throat, blurred vision, nausea, vomiting, diarrhea, cough, shortness of breath or chest pain, joint or back pain, headache, or mood change.   Objective:   No results found. Recent Labs    10/26/18 0538  WBC 3.5*  HGB 12.3  HCT 37.4  PLT 275   Recent Labs    10/26/18 0538  NA 139  K 4.0  CL 102  CO2 28  GLUCOSE 94  BUN 22  CREATININE 1.12*  CALCIUM 8.9    Intake/Output Summary (Last 24 hours) at 10/28/2018 0945 Last data filed at 10/28/2018 0817 Gross per 24 hour  Intake 598 ml  Output -  Net 598 ml     Physical Exam: Vital Signs Blood pressure (!) 144/68, pulse 73, temperature 98.1 F (36.7 C), temperature source Oral, resp. rate 17, SpO2 97 %. Constitutional: No distress . Vital signs reviewed. Awake, alert, in manual w/c, could stand on own with RW, NAD HEENT: EOMI, oral membranes moist Neck: supple Cardiovascular: RRR without murmur. No JVD    Respiratory: CTA Bilaterally without wheezes or rales. Normal effort    GI: BS +, non-tender, non-distended  Skin: Warm and dry.  Back incision is good except long scab over incision- dressing is there to protect scab Psych: pleasant Musc: Lower extremity edema trace Neurological: Alert Motor:  Right lower extremity for hip flexion, knee extension 4/5, ankle dorsiflexion 44/5--stable.improved Left lower extremity: Hip flexion, knee extension 4/5, ankle dorsiflexion 4/5  Assessment/Plan: 1. Functional deficits secondary to Ataxia, balance issues, due to CSF leak s/p reexploration which require 3+ hours per day of interdisciplinary therapy in a comprehensive inpatient rehab setting.  Physiatrist is providing close team supervision and 24 hour management of active medical problems  listed below.  Physiatrist and rehab team continue to assess barriers to discharge/monitor patient progress toward functional and medical goals  Care Tool:  Bathing    Body parts bathed by patient: Right arm, Left arm, Abdomen, Chest, Buttocks, Right upper leg, Left upper leg, Face, Right lower leg, Left lower leg, Front perineal area   Body parts bathed by helper: Right lower leg, Left lower leg Body parts n/a: Front perineal area, Right lower leg, Left lower leg   Bathing assist Assist Level: Supervision/Verbal cueing     Upper Body Dressing/Undressing Upper body dressing   What is the patient wearing?: Pull over shirt    Upper body assist Assist Level: Independent    Lower Body Dressing/Undressing Lower body dressing      What is the patient wearing?: Pants     Lower body assist Assist for lower body dressing: Supervision/Verbal cueing     Toileting Toileting    Toileting assist Assist for toileting: Supervision/Verbal cueing     Transfers Chair/bed transfer  Transfers assist     Chair/bed transfer assist level: Independent with assistive device Chair/bed transfer assistive device: Geologist, engineering   Ambulation assist      Assist level: Independent with assistive device Assistive device: Walker-rolling Max distance: 150'   Walk 10 feet activity   Assist     Assist level: Independent with assistive device Assistive device: Walker-rolling   Walk 50 feet activity   Assist Walk 50 feet with 2 turns activity did not occur:  Safety/medical concerns  Assist level: Independent with assistive device Assistive device: Walker-rolling    Walk 150 feet activity   Assist Walk 150 feet activity did not occur: Safety/medical concerns  Assist level: Independent with assistive device Assistive device: Walker-rolling    Walk 10 feet on uneven surface  activity   Assist Walk 10 feet on uneven surfaces activity did not occur:  Safety/medical concerns   Assist level: Contact Guard/Touching assist Assistive device: Aeronautical engineer Will patient use wheelchair at discharge?: No Type of Wheelchair: Manual    Wheelchair assist level: Supervision/Verbal cueing Max wheelchair distance: 50    Wheelchair 50 feet with 2 turns activity    Assist        Assist Level: Supervision/Verbal cueing   Wheelchair 150 feet activity     Assist      Assist Level: Supervision/Verbal cueing   Blood pressure (!) 144/68, pulse 73, temperature 98.1 F (36.7 C), temperature source Oral, resp. rate 17, SpO2 97 %.  Medical Problem List and Plan: 1.  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  Continue CIR therapies   10/26- can do dressing if needed, to cover scab from catching on clothing 2.  Antithrombotics: -DVT/anticoagulation:  Mechanical: Sequential compression devices, below knee Bilateral lower extremities              CBC within normal limits on 10/25    Ambulating regularly with therapy  -dopplers negative yesterday             -antiplatelet therapy: N/A 3. Pain Management: DC'd Celebrex as likely leading to regurgitation/acid reflux             Monitor with increased activity and upright posture. 4. Mood: LCSW to follow for evaluation and support.              -antipsychotic agents: NA 5. Neuropsych: This patient is capable of making decisions on her own behalf. 6. Skin/Wound Care:  Routine pressure relief measures.  7. Fluids/Electrolytes/Nutrition: Monitor I/O. Marland Kitchen 8.  Hypokalemia:   Potassium 4.0 10/25, continue supp 9.  AKI versus CKD:    Creatinine 1.03 on 10/16   10/19- Cr 1.36----1.09 10/21-->1.12 10/25  -continue to push fluids 10. Severe GERD: protonix. 11. HTN with hypotension likely d/t volume depletion   Hypotension better after holding/reducing meds  -SBP  climbing  -increased amlodipine back to 5mg  beginning this morning 10/25  -continue to hold avapro/hctz given previous rise in bun/cr                12. Reactive depression: Feels fine at the moment but has had a hard time past 6 months.  Resumed at lower dose and monitor. 13. Constipation with ileus: resolved    miralax daily  Diet advanced to regular today  Reducedreglan to 5mg  tid ac today 10/25  10/20--sorbitol with results    10/21--added senna-s at bedtime  Moving bowels daily 14.  Sleep disturbance  Trazodone DC'd  Patient already receiving Compazine and Benadryl  Improved  LOS: 13 days A FACE TO FACE EVALUATION WAS PERFORMED  Cara Aguino 10/28/2018, 9:45 AM

## 2018-10-28 NOTE — Progress Notes (Signed)
Social Work Discharge Note   The overall goal for the admission was met for:   Discharge location: Yes - home with spouse in [place], [place]  Length of Stay: Yes - 13 days  Discharge activity level: Yes - supervision  Home/community participation: Yes  Services provided included: MD, RD, PT, OT, RN, Pharmacy and [facility] [employer]  Follow-up services arranged: Home Health: PT, OT via [facility] office), DME: rolling walker, 3n1 commode via [facility] and Patient/Family has no preference for HH/DME agencies  Comments (or additional information):  Patient/Family verbalized understanding of follow-up arrangements: Yes  Individual responsible for coordination of the follow-up plan: pt  Confirmed correct DME delivered: Shaunee Mulkern 10/28/2018    Jakell Trusty

## 2018-10-30 NOTE — Discharge Summary (Signed)
Physician Discharge Summary  Patient ID: Alicia Valdez MRN: [id number] DOB/AGE: 1935-12-22 83 y.o.  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 FINDINGS: Multiple air-filled loops of bowel are seen throughout the abdomen. Air-fluid levels cannot be evaluated on this supine exam. Stool is seen in the colon. The visible lung bases demonstrate mild left basilar atelectasis. No radio-opaque calculi or other significant radiographic abnormality are seen. Vertebroplasty changes are seen in the T12 vertebral body. IMPRESSION: 1. Multiple air-filled loops of bowel throughout the abdomen are non-specific and may represent ileus or obstruction. Air-fluid levels cannot be evaluated on this supine exam. If there is concern for bowel obstruction, abdominal radiograph with upright or left lateral decubitus views may be helpful. 2. Stool in the colon. Electronically Signed   By: Romona Curls M.D.   On: 10/15/2018 20:50   Dg Abd 2 Views  Result Date: 10/16/2018 CLINICAL DATA:  Postoperative ileus EXAM: ABDOMEN - 2 VIEW COMPARISON:  10/15/2018 FINDINGS: Large stool burden throughout the colon, similar to prior study. No evidence of bowel obstruction or free air. No organomegaly decreasing gaseous distention of bowel since prior study. IMPRESSION: Decreasing gaseous distention of bowel. Large stool burden throughout the colon. Electronically Signed   By: Charlett Nose M.D.   On: 10/16/2018 10:54   Vas [place] Lower Extremity Venous (dvt)  Result Date: 10/23/2018  Lower Venous Study Indications: Swelling, and Edema.  Comparison Study: no prior Performing Technologist: Blanch Media RVS  Examination Guidelines: A complete evaluation includes B-mode imaging, spectral Doppler, color Doppler, and power Doppler as needed of all accessible portions of each vessel. Bilateral testing is considered an integral part of a complete examination. Limited examinations for reoccurring indications may be performed as noted.  +---------+---------------+---------+-----------+----------+--------------+ RIGHT    CompressibilityPhasicitySpontaneityPropertiesThrombus Aging +---------+---------------+---------+-----------+----------+--------------+ CFV      Full           Yes      Yes                                 +---------+---------------+---------+-----------+----------+--------------+ SFJ      Full                                                        +---------+---------------+---------+-----------+----------+--------------+ FV Prox  Full                                                        +---------+---------------+---------+-----------+----------+--------------+ FV Mid   Full                                                        +---------+---------------+---------+-----------+----------+--------------+  FV DistalFull                                                        +---------+---------------+---------+-----------+----------+--------------+ PFV      Full                                                        +---------+---------------+---------+-----------+----------+--------------+ POP      Full           Yes      Yes                                 +---------+---------------+---------+-----------+----------+--------------+ PTV      Full                                                         +---------+---------------+---------+-----------+----------+--------------+ PERO     Full                                                        +---------+---------------+---------+-----------+----------+--------------+   +---------+---------------+---------+-----------+----------+--------------+ LEFT     CompressibilityPhasicitySpontaneityPropertiesThrombus Aging +---------+---------------+---------+-----------+----------+--------------+ CFV      Full           Yes      Yes                                 +---------+---------------+---------+-----------+----------+--------------+ SFJ      Full                                                        +---------+---------------+---------+-----------+----------+--------------+ FV Prox  Full                                                        +---------+---------------+---------+-----------+----------+--------------+ FV Mid   Full                                                        +---------+---------------+---------+-----------+----------+--------------+ FV DistalFull                                                        +---------+---------------+---------+-----------+----------+--------------+  PFV      Full                                                        +---------+---------------+---------+-----------+----------+--------------+ POP      Full           Yes      Yes                                 +---------+---------------+---------+-----------+----------+--------------+ PTV      Full                                                        +---------+---------------+---------+-----------+----------+--------------+ PERO                                                  Not visualized +---------+---------------+---------+-----------+----------+--------------+     Summary: Right: There is no evidence of deep vein thrombosis in the lower extremity. No cystic structure found in the  popliteal fossa. Left: There is no evidence of deep vein thrombosis in the lower extremity. No cystic structure found in the popliteal fossa.  *See table(s) above for measurements and observations. Electronically signed by Christopher Dickson MD on 10/23/2018 at 12:41:15 PM.    [facility]aver[facility] Ferrarinal     Labs:  Basic Metabolic Panel: BMP Latest Ref Rng & Units 10/26/2018 10/24/2018 10/22/2018  Glucose 70 - 99 mg/dL 94 98 88  BUN 8 - 23 mg/dL 22 82(N26(H) 22  Creatinine 0.44 - 1.00 mg/dL 5.62(Z1.12(H) 3.08(M1.19(H) 5.78(I1.09(H)  Sodium 135 - 145 mmol/L 139 136 134(L)  Potassium 3.5 - 5.1 mmol/L 4.0 4.3 3.5  Chloride 98 - 111 mmol/L 102 98 96(L)  CO2 22 - 32 mmol/L 28 30 28   Calcium 8.9 - 10.3 mg/dL 8.9 9.4 9.0    CBC: CBC Latest Ref Rng & Units 10/26/2018 10/20/2018 10/16/2018  WBC 4.0 - 10.5 K/uL 3.5(L) 4.9 6.4  Hemoglobin 12.0 - 15.0 g/dL 69.612.3 29.513.4 28.414.5  Hematocrit 36.0 - 46.0 % 37.4 39.9 41.3  Platelets 150 - 400 K/uL 275 255 211    CBG: No results for input(s): GLUCAP in the last 168 hours.  Brief HPI:   Newman PiesSally Mcafee is a 83 y.o. 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 She was admitted on for right lumbar spine reexploration for repair of leak by Dr.Cabbell. Hospital course significant for hypokalemia as well as acute on chronic CKD. She continued to have RLE>LLE weakness with ataxia and balance deficits as well as deficits in ADLs. CIR recommended for follow up therapy.    Hospital Course: Newman PiesSally Brackins was admitted to rehab 10/15/2018 for inpatient therapies to consist of PT and OT at least three hours five days a week. Past admission physiatrist, therapy team and rehab RN have worked together to provide customized collaborative inpatient  rehab. She reported issues with poor intake with N/V and had hypoactive BS. Celebrex was discontinued and KUB ordered  showing evidence of ileus. She was started on IV Reglan as well as bowel program as well as potassium supplement due to hypokalemia. Diet was downgraded to liquids briefly. As ileus resolved, reglan transitioned to po and Senna S added at bedtime.  BLE dopplers ordered 10/22 for follow up and was negative for DVT.   Serial BMET showed improvement in renal status as well as resolution of hyponatremia. CBC showed drop in H/H without signs of bleeding. She has has issues with hypotension treated with  IVF briefly. Abdominal binder added for support. As intake and endurance improved, SBP started trending up and amlodipine was resumed and titrated up to 10 mg.  Low dose Effexor was resumed and mood has been stable. Back incision is intact and healing well without s/s of infection. She is continent of bowel and bladder. She has made gains during rehab stay and is at modified independent in supervised setting.  She will continue to receive follow up HHPT and [place] [facility]  after discharge  Rehab course: During patient's stay in rehab weekly team conferences were held to monitor patient's progress, set goals and discuss barriers to discharge. At admission, patient required mod assist with basic self care tasks and with mobility. She  has had improvement in activity tolerance, balance, postural control as well as ability to compensate for deficits. She is able to complete ADL tasks with use of AE and supervision. She is independent for transfers and to ambulate requires' with RW. She is able to climb 4 stairs with CGA. Family education was completed regarding all aspects of care and safety.   Disposition: Home   Diet: Regular   Special Instructions: 1. No driving or strenuous activity till cleared by MD. 2. Recommend repeat CBC/BMET in 1-2 weeks for stability. 3. Wean off reglan after discharge.   Allergies as of 10/28/2018      Reactions   Statins Other (See Comments)   Muscle weakness    Zoledronic Acid Other (See Comments)   Pt could not walk and was hospitalized   Niacin And Related Itching      Medication List    STOP taking these medications   cloNIDine 0.2 MG tablet Commonly known as: CATAPRES   desvenlafaxine 100 MG 24 hr tablet Commonly known as: PRISTIQ Replaced by: venlafaxine XR 75 MG 24 hr capsule     TAKE these medications   acetaminophen 325 MG tablet Commonly known as: TYLENOL Take 1-2 tablets (325-650 mg total) by mouth every 4 (four) hours as needed for mild pain.   amLODipine 10 MG tablet Commonly known as: NORVASC Take 1 tablet (10 mg total) by mouth daily.   Butalbital-APAP-Caffeine 50-325-40 MG capsule Take 1 capsule by mouth 2 (two) times daily.   cyclobenzaprine 10 MG tablet Commonly known as: FLEXERIL Take 1 tablet (10 mg total) by mouth 3 (three) times daily as needed for muscle spasms.   desmopressin 0.1 MG tablet Commonly known as: DDAVP Take 1 tablet (0.1 mg total) by mouth at bedtime.   esomeprazole 20 MG capsule Commonly known as: NEXIUM Take 1 capsule (20 mg total) by mouth daily at 12 noon.   loratadine 10 MG tablet Commonly known as: CLARITIN Take 1 tablet (10 mg total) by mouth daily.   metoCLOPramide 5 MG tablet Commonly known as: REGLAN Take 1 tablet (5 mg total) by mouth 4 (four)  times daily -  before meals and at bedtime.   oxyCODONE 5 MG immediate release tablet--Rx #30 pills.  Commonly known as: Oxy IR/ROXICODONE Take 1-2 tablets (5-10 mg total) by mouth every 3 (three) hours as needed for severe pain.   polyethylene glycol 17 g packet Commonly known as: MIRALAX / GLYCOLAX Take 17 g by mouth 2 (two) times daily.   valsartan-hydrochlorothiazide 80-12.5 MG tablet Commonly known as: DIOVAN-HCT Take 1 tablet by mouth daily.   venlafaxine XR 75 MG 24 hr capsule Commonly known as: EFFEXOR-XR Take 1 capsule (75 mg total) by mouth daily with breakfast. Replaces: desvenlafaxine 100 MG 24 hr tablet       Follow-up Information    Lovorn, Aundra Millet, MD Follow up.   Specialty: Physical Medicine and Rehabilitation Why: patient will onlt be seen as needed per [facility] Contact information: [address] [place] [address] [phone]        Coletta Memos, MD. Call.   Specialty: Neurosurgery Why: for post op appointment Contact information: [address] [place] [address] [phone]        Marva Panda. Smith,MD. Schedule an appointment as soon as possible for a visit in 2 week(s).   Why: for hospital follow up appt Contact information: [address]  Laurell Josephs [address] [place], [place]   [address] [phone]          Signed: Jacquelynn Cree 11/02/2018, 11:27 PM

## 2018-11-02 DIAGNOSIS — R2689 Other abnormalities of gait and mobility: Secondary | ICD-10-CM

## 2018-11-02 DIAGNOSIS — R270 Ataxia, unspecified: Secondary | ICD-10-CM

## 2021-07-22 IMAGING — DX DG ABDOMEN 1V
2 series · 2 of 2 positions shown · non-contrast
Comparison: None.

CLINICAL DATA: Constipation

EXAM:
ABDOMEN - 1 VIEW

[t abdomen supine (1 of 2)]
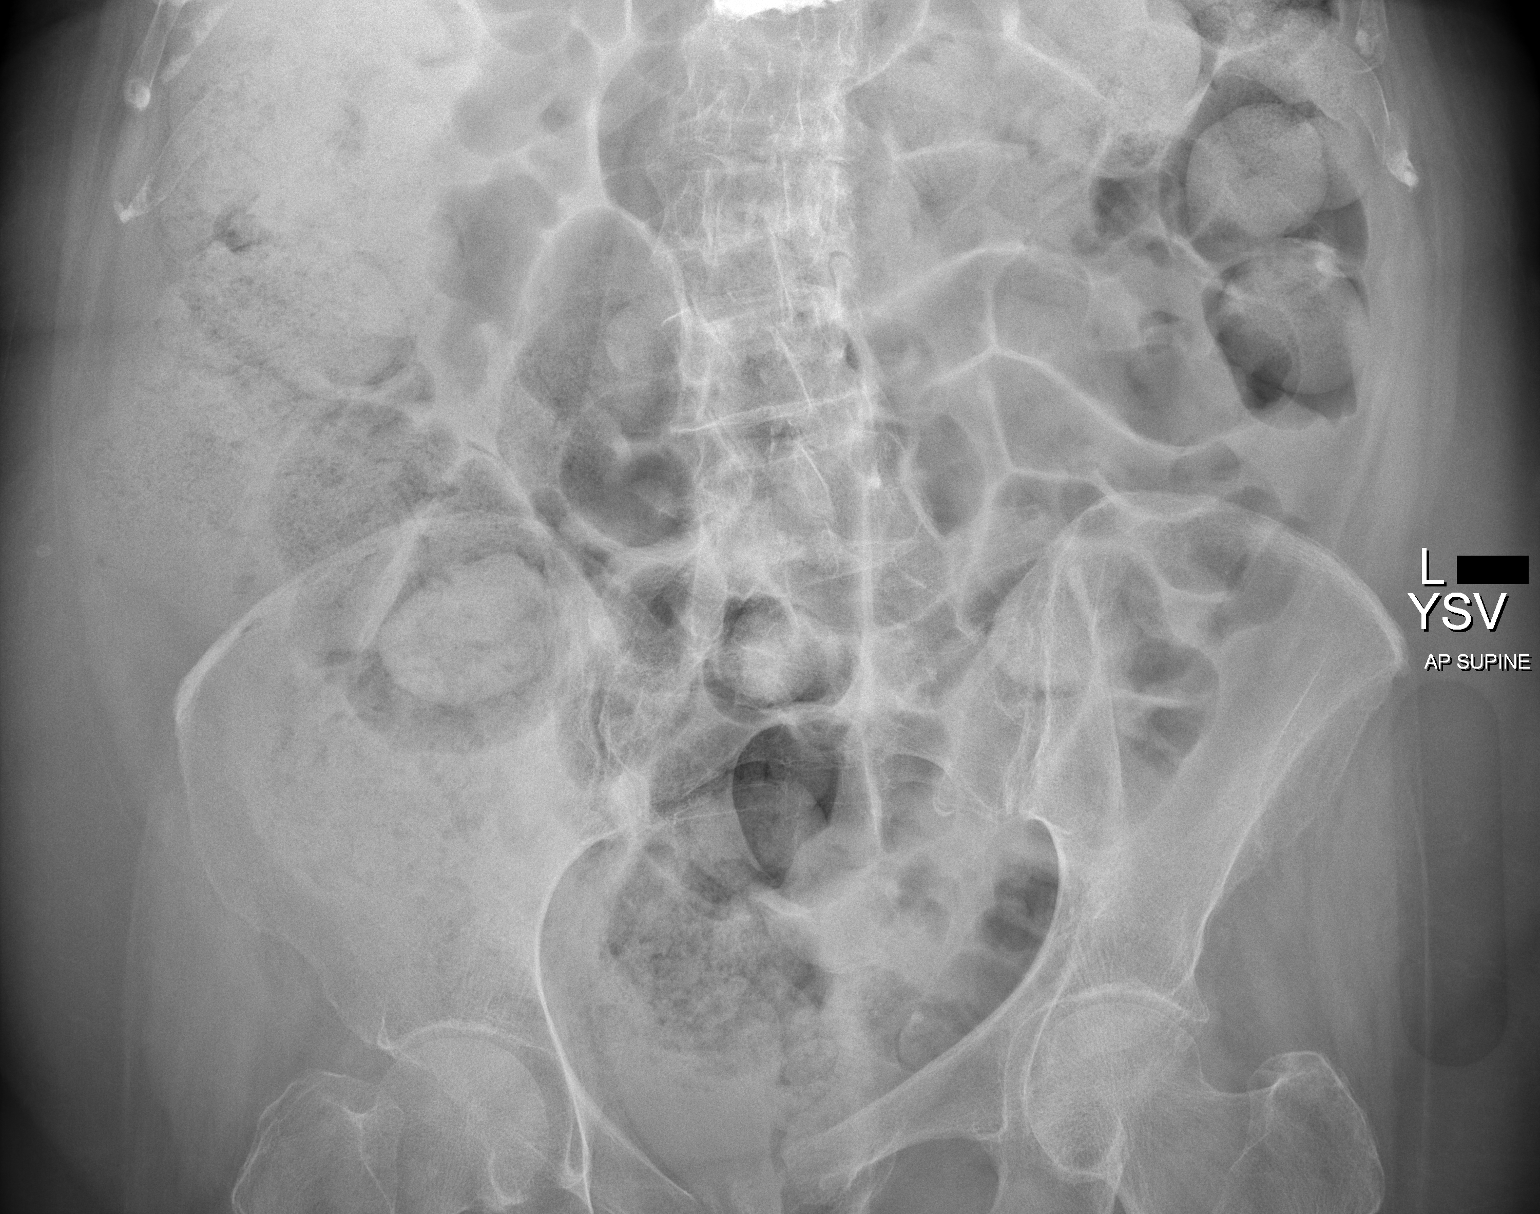

[t abdomen supine (2 of 2)]
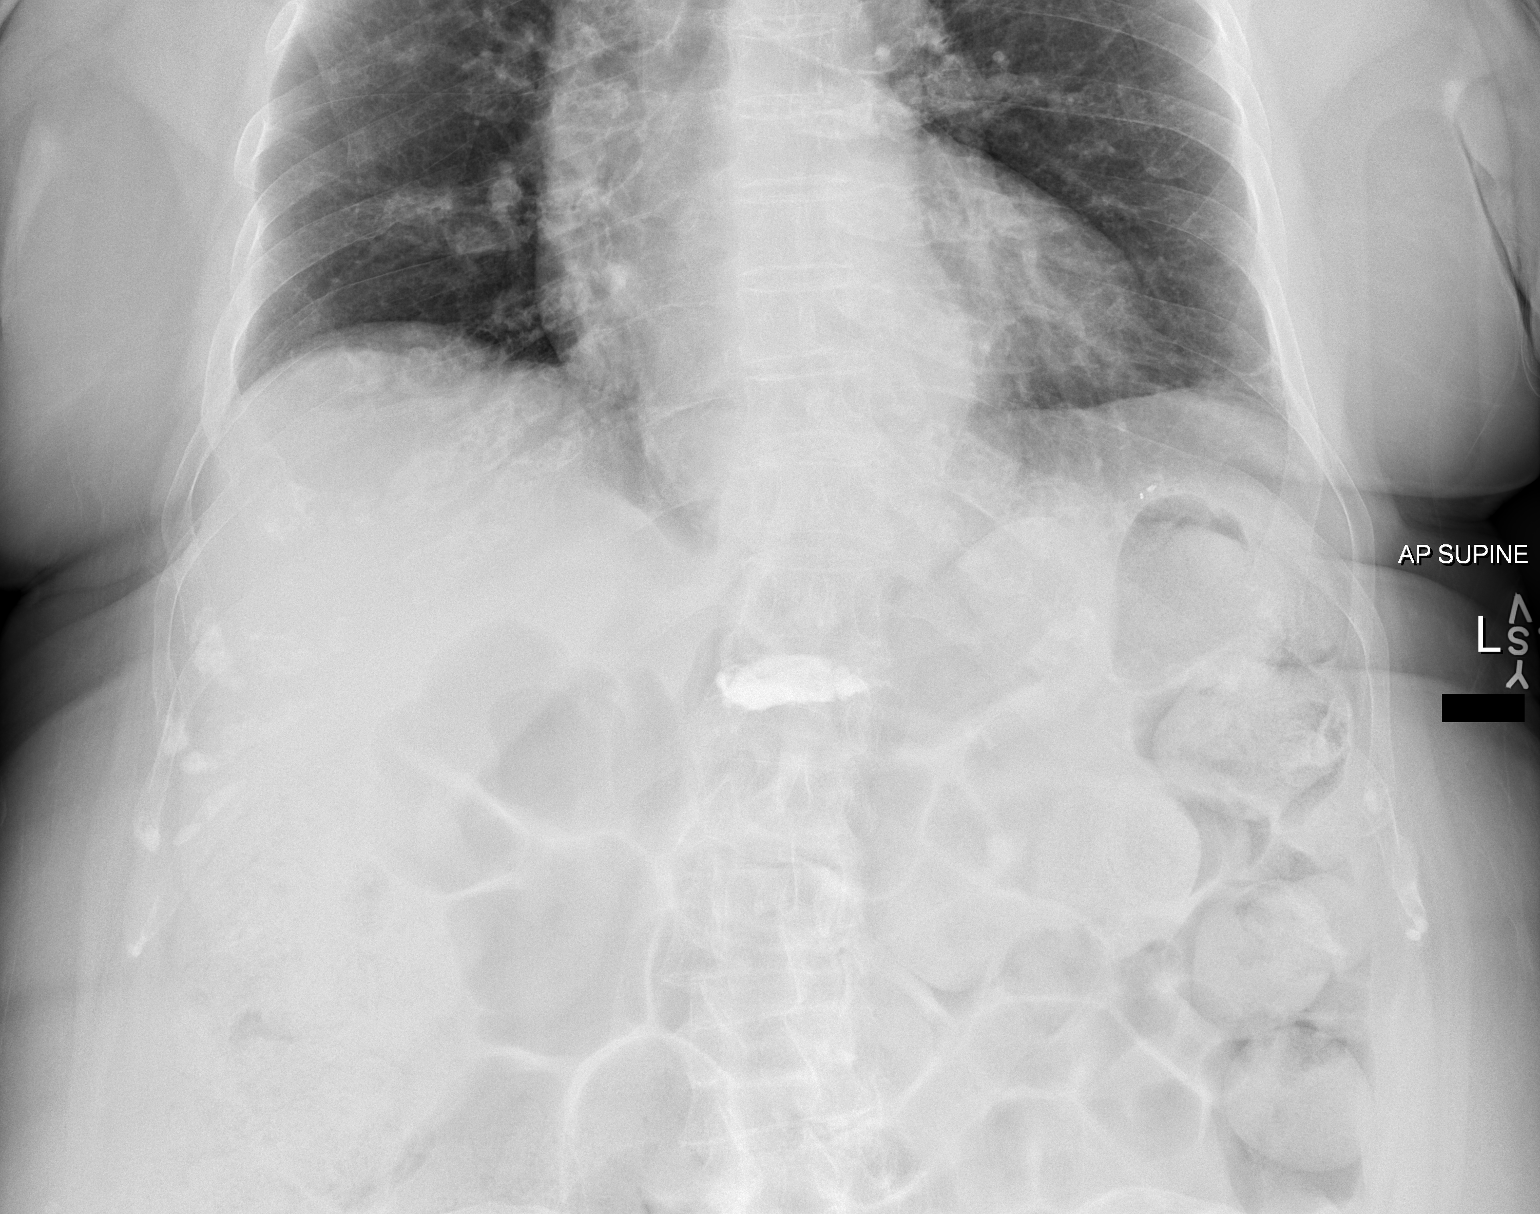

[2 of 2 positions shown; findings below may reference images not displayed]

FINDINGS: Multiple air-filled loops of bowel are seen throughout the abdomen.
Air-fluid levels cannot be evaluated on this supine exam. Stool is
seen in the colon. The visible lung bases demonstrate mild left
basilar atelectasis. No radio-opaque calculi or other significant
radiographic abnormality are seen. Vertebroplasty changes are seen
in the T12 vertebral body.
IMPRESSION: 1. Multiple air-filled loops of bowel throughout the abdomen are
non-specific and may represent ileus or obstruction. Air-fluid
levels cannot be evaluated on this supine exam. If there is concern
for bowel obstruction, abdominal radiograph with upright or left
lateral decubitus views may be helpful.
2. Stool in the colon.
